# Patient Record
Sex: Male | Born: 1960 | Hispanic: No | Marital: Married | Smoking: Never smoker
Health system: Southern US, Community
[De-identification: ages and names within clinical notes are randomized; demographics above are authoritative.]

## PROBLEM LIST (undated history)

## (undated) ENCOUNTER — Ambulatory Visit (HOSPITAL_COMMUNITY): Admission: EM | Payer: Self-pay | Source: Home / Self Care

## (undated) DIAGNOSIS — E785 Hyperlipidemia, unspecified: Secondary | ICD-10-CM

## (undated) DIAGNOSIS — S61511A Laceration without foreign body of right wrist, initial encounter: Secondary | ICD-10-CM

## (undated) DIAGNOSIS — I10 Essential (primary) hypertension: Secondary | ICD-10-CM

## (undated) DIAGNOSIS — E119 Type 2 diabetes mellitus without complications: Secondary | ICD-10-CM

## (undated) HISTORY — DX: Type 2 diabetes mellitus without complications: E11.9

## (undated) HISTORY — PX: OTHER SURGICAL HISTORY: SHX169

## (undated) HISTORY — DX: Laceration without foreign body of right wrist, initial encounter: S61.511A

## (undated) HISTORY — DX: Hyperlipidemia, unspecified: E78.5

---

## 2008-11-05 DIAGNOSIS — E119 Type 2 diabetes mellitus without complications: Secondary | ICD-10-CM

## 2008-11-05 DIAGNOSIS — I10 Essential (primary) hypertension: Secondary | ICD-10-CM

## 2008-11-05 DIAGNOSIS — E785 Hyperlipidemia, unspecified: Secondary | ICD-10-CM

## 2008-11-05 HISTORY — DX: Essential (primary) hypertension: I10

## 2008-11-05 HISTORY — DX: Type 2 diabetes mellitus without complications: E11.9

## 2008-11-05 HISTORY — DX: Hyperlipidemia, unspecified: E78.5

## 2009-07-24 ENCOUNTER — Emergency Department (HOSPITAL_COMMUNITY): Admission: EM | Admit: 2009-07-24 | Discharge: 2009-07-24 | Payer: Self-pay | Admitting: Family Medicine

## 2009-09-07 ENCOUNTER — Emergency Department (HOSPITAL_COMMUNITY): Admission: EM | Admit: 2009-09-07 | Discharge: 2009-09-07 | Payer: Self-pay | Admitting: Emergency Medicine

## 2009-09-23 ENCOUNTER — Encounter: Admission: RE | Admit: 2009-09-23 | Discharge: 2009-09-23 | Payer: Self-pay | Admitting: Infectious Diseases

## 2009-10-03 ENCOUNTER — Emergency Department (HOSPITAL_COMMUNITY): Admission: EM | Admit: 2009-10-03 | Discharge: 2009-10-03 | Payer: Self-pay | Admitting: Family Medicine

## 2011-01-12 ENCOUNTER — Other Ambulatory Visit: Payer: Self-pay | Admitting: Infectious Diseases

## 2011-01-12 ENCOUNTER — Ambulatory Visit
Admission: RE | Admit: 2011-01-12 | Discharge: 2011-01-12 | Disposition: A | Discharge status: Home / Self Care | Payer: No Typology Code available for payment source | Source: Ambulatory Visit | Attending: Infectious Diseases | Admitting: Infectious Diseases

## 2011-01-12 DIAGNOSIS — R5383 Other fatigue: Secondary | ICD-10-CM

## 2011-01-12 DIAGNOSIS — R509 Fever, unspecified: Secondary | ICD-10-CM

## 2011-02-07 LAB — URINALYSIS, ROUTINE W REFLEX MICROSCOPIC
Bilirubin Urine: NEGATIVE
Hgb urine dipstick: NEGATIVE
Ketones, ur: NEGATIVE mg/dL
Nitrite: NEGATIVE
pH: 6.5 (ref 5.0–8.0)

## 2011-02-07 LAB — URINE CULTURE
Colony Count: NO GROWTH
Culture: NO GROWTH

## 2011-02-07 LAB — PREGNANCY, URINE: Preg Test, Ur: NEGATIVE

## 2011-06-19 ENCOUNTER — Inpatient Hospital Stay (INDEPENDENT_AMBULATORY_CARE_PROVIDER_SITE_OTHER)
Admission: RE | Admit: 2011-06-19 | Discharge: 2011-06-19 | Disposition: A | Discharge status: Home / Self Care | Payer: Self-pay | Source: Ambulatory Visit | Attending: Family Medicine | Admitting: Family Medicine

## 2011-06-19 DIAGNOSIS — I10 Essential (primary) hypertension: Secondary | ICD-10-CM

## 2015-08-15 ENCOUNTER — Encounter (HOSPITAL_COMMUNITY): Payer: Self-pay | Admitting: *Deleted

## 2015-08-15 ENCOUNTER — Emergency Department (INDEPENDENT_AMBULATORY_CARE_PROVIDER_SITE_OTHER)
Admission: EM | Admit: 2015-08-15 | Discharge: 2015-08-15 | Disposition: A | Discharge status: Home / Self Care | Payer: Self-pay | Source: Home / Self Care | Attending: Family Medicine | Admitting: Family Medicine

## 2015-08-15 DIAGNOSIS — I10 Essential (primary) hypertension: Secondary | ICD-10-CM

## 2015-08-15 HISTORY — DX: Essential (primary) hypertension: I10

## 2015-08-15 LAB — POCT I-STAT, CHEM 8
BUN: 14 mg/dL (ref 6–20)
CREATININE: 0.9 mg/dL (ref 0.61–1.24)
Calcium, Ion: 1.22 mmol/L (ref 1.12–1.23)
Chloride: 101 mmol/L (ref 101–111)
GLUCOSE: 128 mg/dL — AB (ref 65–99)
HCT: 44 % (ref 39.0–52.0)
HEMOGLOBIN: 15 g/dL (ref 13.0–17.0)
POTASSIUM: 3.8 mmol/L (ref 3.5–5.1)
Sodium: 140 mmol/L (ref 135–145)
TCO2: 26 mmol/L (ref 0–100)

## 2015-08-15 MED ORDER — LISINOPRIL 20 MG PO TABS: 20.0000 mg | ORAL_TABLET | Freq: Every day | ORAL | Status: DC

## 2015-08-15 NOTE — ED Provider Notes (Signed)
CSN: [id number]     Arrival date & time 08/15/15  1618 History   First MD Initiated Contact with Patient 08/15/15 1749     Chief Complaint  Patient presents with  . Medication Refill   (Consider location/radiation/quality/duration/timing/severity/associated sxs/prior Treatment) Patient is a 54 y.o. male presenting with hypertension. The history is provided by the patient.  Hypertension This is a chronic problem. The current episode started 2 days ago (out of med for 2 d.). The problem has not changed since onset.Pertinent negatives include no chest pain and no headaches.    Past Medical History  Diagnosis Date  . Hypertension    History reviewed. No pertinent past surgical history. History reviewed. No pertinent family history. Social History  Substance Use Topics  . Smoking status: Never Smoker   . Smokeless tobacco: None  . Alcohol Use: No    Review of Systems  Constitutional: Negative.   Respiratory: Negative.   Cardiovascular: Negative.  Negative for chest pain, palpitations and leg swelling.  Neurological: Negative for dizziness and headaches.  All other systems reviewed and are negative.   Allergies  Review of patient's allergies indicates no known allergies.  Home Medications   Prior to Admission medications   Medication Sig Start Date End Date Taking? Authorizing Provider  lisinopril (PRINIVIL,ZESTRIL) 20 MG tablet Take 1 tablet (20 mg total) by mouth daily. For blood pressure 08/15/15   Linna Hoff, MD   Meds Ordered and Administered this Visit  Medications - No data to display  BP 150/90 mmHg  Pulse 58  Temp(Src) 98.1 F (36.7 C) (Oral)  Resp 16  SpO2 99% No data found.   Physical Exam  Constitutional: He is oriented to person, place, and time. He appears well-developed and well-nourished.  Neck: Normal range of motion. Neck supple.  Cardiovascular: Normal rate, normal heart sounds and intact distal pulses.   Pulmonary/Chest: Effort normal and  breath sounds normal.  Abdominal: There is no tenderness.  Musculoskeletal: He exhibits no edema.  Lymphadenopathy:    He has no cervical adenopathy.  Neurological: He is alert and oriented to person, place, and time.  Skin: Skin is warm and dry.  Nursing note and vitals reviewed.   ED Course  Procedures (including critical care time)  Labs Review Labs Reviewed  POCT I-STAT, CHEM 8 - Abnormal; Notable for the following:    Glucose, Bld 128 (*)    All other components within normal limits    Imaging Review No results found.   Visual Acuity Review  Right Eye Distance:   Left Eye Distance:   Bilateral Distance:    Right Eye Near:   Left Eye Near:    Bilateral Near:         MDM   1. Essential hypertension    rx for lisinopril given.    Linna Hoff, MD 08/15/15 [phone]

## 2015-08-15 NOTE — ED Notes (Signed)
Pt  Is  Here  For  rx  Of  Lisinopril  20  Mg  Daily  He is  Out  Of meds  And  Could  Not  Get  An  appt  Until  Next  Month  He  States

## 2015-08-15 NOTE — Discharge Instructions (Signed)
See your doctor for further care °

## 2015-08-25 ENCOUNTER — Emergency Department (HOSPITAL_COMMUNITY): Payer: Worker's Compensation

## 2015-08-25 ENCOUNTER — Emergency Department (HOSPITAL_COMMUNITY)
Admission: EM | Admit: 2015-08-25 | Discharge: 2015-08-25 | Disposition: A | Discharge status: Home / Self Care | Payer: Worker's Compensation | Attending: Emergency Medicine | Admitting: Emergency Medicine

## 2015-08-25 ENCOUNTER — Encounter (HOSPITAL_COMMUNITY): Payer: Self-pay | Admitting: Neurology

## 2015-08-25 DIAGNOSIS — S51811A Laceration without foreign body of right forearm, initial encounter: Secondary | ICD-10-CM | POA: Insufficient documentation

## 2015-08-25 DIAGNOSIS — I10 Essential (primary) hypertension: Secondary | ICD-10-CM | POA: Diagnosis not present

## 2015-08-25 DIAGNOSIS — Z23 Encounter for immunization: Secondary | ICD-10-CM | POA: Insufficient documentation

## 2015-08-25 DIAGNOSIS — Z79899 Other long term (current) drug therapy: Secondary | ICD-10-CM | POA: Diagnosis not present

## 2015-08-25 DIAGNOSIS — Y9389 Activity, other specified: Secondary | ICD-10-CM | POA: Diagnosis not present

## 2015-08-25 DIAGNOSIS — Y998 Other external cause status: Secondary | ICD-10-CM | POA: Diagnosis not present

## 2015-08-25 DIAGNOSIS — W311XXA Contact with metalworking machines, initial encounter: Secondary | ICD-10-CM | POA: Insufficient documentation

## 2015-08-25 DIAGNOSIS — S61511A Laceration without foreign body of right wrist, initial encounter: Secondary | ICD-10-CM

## 2015-08-25 DIAGNOSIS — Y9289 Other specified places as the place of occurrence of the external cause: Secondary | ICD-10-CM | POA: Diagnosis not present

## 2015-08-25 HISTORY — DX: Laceration without foreign body of right wrist, initial encounter: S61.511A

## 2015-08-25 MED ORDER — LIDOCAINE-EPINEPHRINE (PF) 2 %-1:200000 IJ SOLN
10.0000 mL | Freq: Once | INTRAMUSCULAR | Status: AC
  Administered 2015-08-25: 10 mL via INTRADERMAL
  Filled 2015-08-25: qty 20

## 2015-08-25 MED ORDER — TETANUS-DIPHTH-ACELL PERTUSSIS 5-2.5-18.5 LF-MCG/0.5 IM SUSP
0.5000 mL | Freq: Once | INTRAMUSCULAR | Status: AC
  Administered 2015-08-25: 0.5 mL via INTRAMUSCULAR
  Filled 2015-08-25: qty 0.5

## 2015-08-25 MED ORDER — IBUPROFEN 600 MG PO TABS: 600.0000 mg | ORAL_TABLET | Freq: Four times a day (QID) | ORAL | Status: DC | PRN

## 2015-08-25 MED ORDER — BACITRACIN ZINC 500 UNIT/GM EX OINT: 1.0000 "application " | TOPICAL_OINTMENT | Freq: Two times a day (BID) | CUTANEOUS | Status: DC

## 2015-08-25 MED ORDER — TRAMADOL HCL 50 MG PO TABS: 50.0000 mg | ORAL_TABLET | Freq: Four times a day (QID) | ORAL | Status: DC | PRN

## 2015-08-25 NOTE — Discharge Instructions (Signed)
Ibuprofen for pain. Tramadol for severe pain. Bacitracin twice a day to the laceration. Keep splint on for support. Follow-up in 10 days for suture removal. Watch for any signs of infection and follow-up is appears to be infected. Return if any problems.   Laceration Care, Adult A laceration is a cut that goes through all of the layers of the skin and into the tissue that is right under the skin. Some lacerations heal on their own. Others need to be closed with stitches (sutures), staples, skin adhesive strips, or skin glue. Proper laceration care minimizes the risk of infection and helps the laceration to heal better. HOW TO CARE FOR YOUR LACERATION If sutures or staples were used:  Keep the wound clean and dry.  If you were given a bandage (dressing), you should change it at least one time per day or as told by your health care provider. You should also change it if it becomes wet or dirty.  Keep the wound completely dry for the first 24 hours or as told by your health care provider. After that time, you may shower or bathe. However, make sure that the wound is not soaked in water until after the sutures or staples have been removed.  Clean the wound one time each day or as told by your health care provider:  Wash the wound with soap and water.  Rinse the wound with water to remove all soap.  Pat the wound dry with a clean towel. Do not rub the wound.  After cleaning the wound, apply a thin layer of antibiotic ointmentas told by your health care provider. This will help to prevent infection and keep the dressing from sticking to the wound.  Have the sutures or staples removed as told by your health care provider. If skin adhesive strips were used:  Keep the wound clean and dry.  If you were given a bandage (dressing), you should change it at least one time per day or as told by your health care provider. You should also change it if it becomes dirty or wet.  Do not get the skin  adhesive strips wet. You may shower or bathe, but be careful to keep the wound dry.  If the wound gets wet, pat it dry with a clean towel. Do not rub the wound.  Skin adhesive strips fall off on their own. You may trim the strips as the wound heals. Do not remove skin adhesive strips that are still stuck to the wound. They will fall off in time. If skin glue was used:  Try to keep the wound dry, but you may briefly wet it in the shower or bath. Do not soak the wound in water, such as by swimming.  After you have showered or bathed, gently pat the wound dry with a clean towel. Do not rub the wound.  Do not do any activities that will make you sweat heavily until the skin glue has fallen off on its own.  Do not apply liquid, cream, or ointment medicine to the wound while the skin glue is in place. Using those may loosen the film before the wound has healed.  If you were given a bandage (dressing), you should change it at least one time per day or as told by your health care provider. You should also change it if it becomes dirty or wet.  If a dressing is placed over the wound, be careful not to apply tape directly over the skin glue. Doing  that may cause the glue to be pulled off before the wound has healed.  Do not pick at the glue. The skin glue usually remains in place for 5-10 days, then it falls off of the skin. General Instructions  Take over-the-counter and prescription medicines only as told by your health care provider.  If you were prescribed an antibiotic medicine or ointment, take or apply it as told by your doctor. Do not stop using it even if your condition improves.  To help prevent scarring, make sure to cover your wound with sunscreen whenever you are outside after stitches are removed, after adhesive strips are removed, or when glue remains in place and the wound is healed. Make sure to wear a sunscreen of at least 30 SPF.  Do not scratch or pick at the wound.  Keep all  follow-up visits as told by your health care provider. This is important.  Check your wound every day for signs of infection. Watch for:  Redness, swelling, or pain.  Fluid, blood, or pus.  Raise (elevate) the injured area above the level of your heart while you are sitting or lying down, if possible. SEEK MEDICAL CARE IF:  You received a tetanus shot and you have swelling, severe pain, redness, or bleeding at the injection site.  You have a fever.  A wound that was closed breaks open.  You notice a bad smell coming from your wound or your dressing.  You notice something coming out of the wound, such as wood or glass.  Your pain is not controlled with medicine.  You have increased redness, swelling, or pain at the site of your wound.  You have fluid, blood, or pus coming from your wound.  You notice a change in the color of your skin near your wound.  You need to change the dressing frequently due to fluid, blood, or pus draining from the wound.  You develop a new rash.  You develop numbness around the wound. SEEK IMMEDIATE MEDICAL CARE IF:  You develop severe swelling around the wound.  Your pain suddenly increases and is severe.  You develop painful lumps near the wound or on skin that is anywhere on your body.  You have a red streak going away from your wound.  The wound is on your hand or foot and you cannot properly move a finger or toe.  The wound is on your hand or foot and you notice that your fingers or toes look pale or bluish.   This information is not intended to replace advice given to you by your health care provider. Make sure you discuss any questions you have with your health care provider.   Document Released: 10/22/2005 Document Revised: 03/08/2015 Document Reviewed: 10/18/2014 Elsevier Interactive Patient Education [employer].

## 2015-08-25 NOTE — ED Notes (Signed)
Pt stable, ambulatory, states understanding of discharge instructions 

## 2015-08-25 NOTE — ED Notes (Signed)
Pt was working today and went to get a part out of a machine and the machine was still on. Has 2 inch laceration to right medial lower forearm. Is "C" shaped, bleeding controlled.

## 2015-08-25 NOTE — ED Provider Notes (Signed)
CSN: [id number]     Arrival date & time 08/25/15  1510 History   First MD Initiated Contact with Patient 08/25/15 1537     Chief Complaint  Patient presents with  . Extremity Laceration     (Consider location/radiation/quality/duration/timing/severity/associated sxs/prior Treatment) HPI Craig Miller is a 54 y.o. male history of hypertension, presents to emergency department with laceration to the right forearm. Patient states he was at work, working with sharp machinery, when he states machine slipped and cut him on the dorsal right wrist. He reports bleeding which is now controlled with pressure. He states he is having pain with range of motion of the wrist joint. He denies any numbness or tingling distal to the injury. He is unsure of his tetanus shot. No other treatments provided.  Past Medical History  Diagnosis Date  . Hypertension    History reviewed. No pertinent past surgical history. No family history on file. Social History  Substance Use Topics  . Smoking status: Never Smoker   . Smokeless tobacco: None  . Alcohol Use: No    Review of Systems  Constitutional: Negative for fever and chills.  Respiratory: Negative for cough, chest tightness and shortness of breath.   Cardiovascular: Negative for chest pain, palpitations and leg swelling.  Musculoskeletal: Positive for arthralgias. Negative for myalgias, neck pain and neck stiffness.  Skin: Positive for wound. Negative for rash.  Allergic/Immunologic: Negative for immunocompromised state.  Neurological: Negative for dizziness.      Allergies  Review of patient's allergies indicates no known allergies.  Home Medications   Prior to Admission medications   Medication Sig Start Date End Date Taking? Authorizing Provider  lisinopril (PRINIVIL,ZESTRIL) 20 MG tablet Take 20 mg by mouth every evening.   Yes Historical Provider, MD   BP 190/92 mmHg  Pulse 52  Temp(Src) 98.2 F (36.8 C) (Oral)  Resp 14  SpO2  98% Physical Exam  Constitutional: He appears well-developed and well-nourished. No distress.  HENT:  Head: Normocephalic and atraumatic.  Eyes: Conjunctivae are normal.  Neck: Neck supple.  Cardiovascular: Normal rate, regular rhythm and normal heart sounds.   Pulmonary/Chest: Effort normal. No respiratory distress. He has no wheezes. He has no rales.  Musculoskeletal: He exhibits no edema.  Full range of motion of the wrist and all fingers. 5 out of 5 strength with wrist flexion and extension against resistance. 5 out of 5 strength with all finger flexion and extension against resistance at all joints. Sensation is intact distally in all fingers dorsally and ventrally. Distal radial pulse intact. Cap refill less than 2 seconds distally.  Neurological: He is alert.  Skin: Skin is warm and dry.  7cm deep flap laceration to the dorsal left wrist. Hemostatic.   Nursing note and vitals reviewed.   ED Course  Procedures (including critical care time) Labs Review Labs Reviewed - No data to display  Imaging Review No results found. I have personally reviewed and evaluated these images and lab results as part of my medical decision-making.   EKG Interpretation None      LACERATION REPAIR Performed by: Lottie Mussel Authorized by: Jaynie Crumble A Consent: Verbal consent obtained. Risks and benefits: risks, benefits and alternatives were discussed Consent given by: patient Patient identity confirmed: provided demographic data Prepped and Draped in normal sterile fashion Wound explored  Laceration Location: right wrist  Laceration Length: 7cm  No Foreign Bodies seen or palpated  Anesthesia: local infiltration  Local anesthetic: lidocaine 2% w epinephrine  Anesthetic  total: 4 ml  Irrigation method: syringe Amount of cleaning: standard  Skin closure: deep vicril rapid 4.0, superficial prolene 4.0  Number of sutures: 2 deep through fascia, 7  superficial  Technique: simple interrupted  Patient tolerance: Patient tolerated the procedure well with no immediate complications.  MDM   Final diagnoses:  Laceration of forearm, right, initial encounter     patient with deep flap laceration to the dorsal right wrist. Repaired with sutures, requiring some deep tissue sutures to pull fascia together. There is no evidence of nerve or tendon injury. He has full strength and sensation to the hand. Will place in the Velcro splint for support. Home with tramadol, naproxen, topical antibiotic, follow up as needed in for suture removal. Tetanus updated. Patient was found to have elevated blood pressure, instructed to follow-up with primary care doctor.  Filed Vitals:   08/25/15 1527 08/25/15 1749  BP: 190/92 166/85  Pulse: 52 58  Temp: 98.2 F (36.8 C)   TempSrc: Oral   Resp: 14 16  SpO2: 98% 99%      Jaynie Crumbleatyana Lamyia Cdebaca, PA-C 08/25/15 2021  Doug SouSam Jacubowitz, MD 08/26/15 0045

## 2015-08-26 ENCOUNTER — Encounter (HOSPITAL_COMMUNITY): Payer: Self-pay | Admitting: *Deleted

## 2015-09-05 ENCOUNTER — Encounter (HOSPITAL_COMMUNITY): Payer: Self-pay | Admitting: Emergency Medicine

## 2015-09-05 ENCOUNTER — Emergency Department (HOSPITAL_COMMUNITY)
Admission: EM | Admit: 2015-09-05 | Discharge: 2015-09-05 | Disposition: A | Discharge status: Home / Self Care | Payer: Self-pay | Attending: Emergency Medicine | Admitting: Emergency Medicine

## 2015-09-05 DIAGNOSIS — I10 Essential (primary) hypertension: Secondary | ICD-10-CM | POA: Insufficient documentation

## 2015-09-05 DIAGNOSIS — X58XXXD Exposure to other specified factors, subsequent encounter: Secondary | ICD-10-CM | POA: Insufficient documentation

## 2015-09-05 DIAGNOSIS — Z792 Long term (current) use of antibiotics: Secondary | ICD-10-CM | POA: Insufficient documentation

## 2015-09-05 DIAGNOSIS — Z5189 Encounter for other specified aftercare: Secondary | ICD-10-CM

## 2015-09-05 DIAGNOSIS — S6991XD Unspecified injury of right wrist, hand and finger(s), subsequent encounter: Secondary | ICD-10-CM

## 2015-09-05 DIAGNOSIS — Z4801 Encounter for change or removal of surgical wound dressing: Secondary | ICD-10-CM | POA: Insufficient documentation

## 2015-09-05 DIAGNOSIS — Z79899 Other long term (current) drug therapy: Secondary | ICD-10-CM | POA: Insufficient documentation

## 2015-09-05 NOTE — ED Provider Notes (Signed)
Laceration 10 days ago during crush injury at work, right dorsal wrist with laceration that was repaired, at the time no neurovascular compromise, no tendon compromise, states that over the last week he has developed numbness and weakness in the third and fourth digits of the right hand. On exam the laceration appears to be healing well, no redness drainage or tenderness, he has decreased sensation to the third and fourth digit, normal extension at the wrist, normal flexion at the wrist, normal flexion of the fingers, increased strength with extension of the third and fourth digits of the right hand.  Discussed with nurse at hand surgical offices, they have discussed this with the physician assistant, the patient will be seen tomorrow by Dr. Mina MarbleWeingold, the patient is to call and make an appointment according to the nurse at the office. Patient has been given this information  Medical screening examination/treatment/procedure(s) were conducted as a shared visit with non-physician practitioner(s) and myself.  I personally evaluated the patient during the encounter.  Clinical Impression:   Final diagnoses:  Hand injury, right, subsequent encounter  Visit for wound check         Eber HongBrian Coden Franchi, MD 09/07/15 0710

## 2015-09-05 NOTE — ED Provider Notes (Signed)
CSN: [id number]     Arrival date & time 09/05/15  0919 History  By signing my name below, I, Craig Miller, attest that this documentation has been prepared under the direction and in the presence of non-physician practitioner, Dierdre ForthHannah Naw Lasala, PA-C. Electronically Signed: Freida Busmaniana Miller, Scribe. 09/05/2015. 11:06 AM.  Chief Complaint  Patient presents with  . Hand Pain   The history is provided by the patient and medical records. No language interpreter was used.    HPI Comments:  Craig Miller is a 54 y.o. male who presents to the Emergency Department complaining of right hand pain. Pt reports pain with movement of the hand. He also notes difficulty moving his right middle and ring fingers for 9 days.  Pt originallly injured the right forearm on 08/25/15 with a machine; he was evaluated in the ED after injury. He had the laceration to his forearm repaired, was placed in a splint, had tetanus updated and was discharged home with naproxen, and topical antibiotic. Pt has cleaned the wound twice since discharge. No alleviating factors noted.  Patient also reports associated decreased sensation in the right middle, ring and little fingers.  Past Medical History  Diagnosis Date  . Hypertension    History reviewed. No pertinent past surgical history. History reviewed. No pertinent family history. Social History  Substance Use Topics  . Smoking status: Never Smoker   . Smokeless tobacco: None  . Alcohol Use: No    Review of Systems  Constitutional: Negative for fever and chills.  Gastrointestinal: Negative for nausea and vomiting.  Musculoskeletal: Positive for joint swelling and arthralgias. Negative for back pain, neck pain and neck stiffness.       Decreased range of motion  Skin: Positive for wound.  Neurological: Positive for numbness (decreased sensation).  Hematological: Does not bruise/bleed easily.  Psychiatric/Behavioral: The patient is not nervous/anxious.   All  other systems reviewed and are negative.     Allergies  Review of patient's allergies indicates no known allergies.  Home Medications   Prior to Admission medications   Medication Sig Start Date End Date Taking? Authorizing Provider  bacitracin ointment Apply 1 application topically 2 (two) times daily. 08/25/15   Tatyana Kirichenko, PA-C  ibuprofen (ADVIL,MOTRIN) 600 MG tablet Take 1 tablet (600 mg total) by mouth every 6 (six) hours as needed. 08/25/15   Tatyana Kirichenko, PA-C  lisinopril (PRINIVIL,ZESTRIL) 20 MG tablet Take 1 tablet (20 mg total) by mouth daily. For blood pressure 08/15/15   Linna HoffJames D Kindl, MD  lisinopril (PRINIVIL,ZESTRIL) 20 MG tablet Take 20 mg by mouth every evening.    Historical Provider, MD  traMADol (ULTRAM) 50 MG tablet Take 1 tablet (50 mg total) by mouth every 6 (six) hours as needed. 08/25/15   Tatyana Kirichenko, PA-C   BP 174/91 mmHg  Pulse 69  Temp(Src) 98.6 F (37 C) (Oral)  Resp 16  Ht 5' 2.99" (1.6 m)  Wt 148 lb (67.132 kg)  BMI 26.22 kg/m2  SpO2 99% Physical Exam  Constitutional: He appears well-developed and well-nourished. No distress.  HENT:  Head: Normocephalic and atraumatic.  Eyes: Conjunctivae are normal.  Neck: Normal range of motion.  Cardiovascular: Normal rate, regular rhythm, normal heart sounds and intact distal pulses.   No murmur heard. Capillary refill < 3 sec  Pulmonary/Chest: Effort normal and breath sounds normal.  Musculoskeletal: He exhibits tenderness. He exhibits no edema.  ROM: significantly limited ROM of the right wrist with pain  Mild persistent flexion of the right  middle, ring and little fingers at rest  Decreased flexion of all fingers Full active extension of right thumb and pointer fingers with decreased active extension of the middle, ring, and little finger; full passive flexion and extension of all fingers No tenderness to palpation of any of the tendons   Neurological: He is alert. Coordination  normal.  Sensation intact though subjective decreased sensation in right middle, ring, and little fingers  Strength: 4/5 grip strength and finger flexion with right hand and 2/5 strength with extension extension of the right middle, ring, and little fingers  4/5 strength with extension of the right thumb and pointer finger  Skin: Skin is warm and dry. He is not diaphoretic.  No tenting of the skin 7 cm lac to the dorsum of the right wrsit with 7 suture sin tact no prulent drainge ery or increased warmth   Psychiatric: He has a normal mood and affect.  Nursing note and vitals reviewed.   ED Course  Procedures   DIAGNOSTIC STUDIES:  Oxygen Saturation is 100% on RA, normal by my interpretation.    COORDINATION OF CARE:  9:31 AM Discussed treatment plan with pt at bedside and pt agreed to plan.  Labs Review Labs Reviewed - No data to display  Imaging Review No results found.   EKG Interpretation None      MDM   Final diagnoses:  Hand injury, right, subsequent encounter  Visit for wound check    [name] presents for suture removal and decreased extension of the right middle, ring and little fingers.  Record review shows pt had full ROM of right wrist and fingers with 5/5 strength at time of injury and laceration repair.  Laceration appears clean without erythema, induration or purulent drainage. It is healing well.  Patient's exam concerning for possible tendon or nerve damage.   Patient was discussed with and evaluated by Dr. Eber Hong who consulted with hand surgery. Dr. Mina Marble will see the patient in his office tomorrow for further evaluation. Sutures left in place for evaluation at hand surgery office.  Wound was re-bandaged and and patient's cock up wrist splint was placed back on his arm.    Pt reports understanding of his follow-up instructions.  ,I personally performed the services described in this documentation, which was scribed in my presence.  The recorded information has been reviewed and is accurate.    Dierdre Forth, PA-C 09/05/15 1106  Eber Hong, MD 09/07/15 [phone]

## 2015-09-05 NOTE — ED Notes (Signed)
Pt reports he injured his R hand/wrist and has pain with moving; states he cant straighten middle and ring finger

## 2015-09-05 NOTE — Discharge Instructions (Signed)
1. Medications: Tylenol or ibuprofen for pain, usual home medications 2. Treatment: ice for swelling, keep wound clean with warm soap and water and keep bandage dry,  3. Follow Up: Please follow-up with Dr. Mina MarbleWeingold tomorrow for further evaluation    Emergency Department Resource Guide 1) Find a Doctor and Pay Out of Pocket Although you won't have to find out who is covered by your insurance plan, it is a good idea to ask around and get recommendations. You will then need to call the office and see if the doctor you have chosen will accept you as a new patient and what types of options they offer for patients who are self-pay. Some doctors offer discounts or will set up payment plans for their patients who do not have insurance, but you will need to ask so you aren't surprised when you get to your appointment.  2) Contact Your Local Health Department Not all health departments have doctors that can see patients for sick visits, but many do, so it is worth a call to see if yours does. If you don't know where your local health department is, you can check in your phone book. The CDC also has a tool to help you locate your state's health department, and many state websites also have listings of all of their local health departments.  3) Find a Walk-in Clinic If your illness is not likely to be very severe or complicated, you may want to try a walk in clinic. These are popping up all over the country in pharmacies, drugstores, and shopping centers. They're usually staffed by nurse practitioners or physician assistants that have been trained to treat common illnesses and complaints. They're usually fairly quick and inexpensive. However, if you have serious medical issues or chronic medical problems, these are probably not your best option.  No Primary Care Doctor: - Call Health Connect at  [phone] - they can help you locate a primary care doctor that  accepts your insurance, provides certain services,  etc. - Physician Referral Service- [phone]  Chronic Pain Problems: Organization         Address  Phone   Notes  [facility]  [phone]) [phone] Patients need to be referred by their primary care doctor.   Medication Assistance: Organization         Address  Phone   Notes  [facility] [address] [place], [place] [address] [phone]) [phone] --Must be a resident of [facility] -- Must have NO insurance coverage whatsoever (no Medicaid/ Medicare, etc.) -- The pt. MUST have a primary care doctor that directs their care regularly and follows them in the community   MedAssist  [phone]   [employer]  [phone]    Agencies that provide inexpensive medical care: Organization         Address  Phone   Notes  [facility]  [phone]   [facility]    [phone]) [phone]   [facility] [address] [place], [place] [address] [phone]) [phone]   [facility] [address], [place] [phone]) [phone]   [facility]anned Parenthood    [phone]   [facility]    [phone]   [facility]  [address], [place] Phone:  [phone](336) [phone], Fax:  [phone] Hours of Operation:  9 am - 6 pm, M-F.  Also accepts Medicaid/Medicare  and self-pay.  [facility] for [place], [address], [place] Phone: [phone], Fax: [phone]. Hours of Operation:  8:30 am - 5:30 pm, M-F.  Also accepts Medicaid and self-pay.  [facility] [place] [address], [place] Phone: [phone]   [place], [place], [place] [phone], Ext. [phone] Mondays & Thursdays: 7-9 AM.  First 15 patients are seen on a first come, first serve basis.    [place] Providers:  Organization         Address  Phone   Notes  [facility] [address], [place] [phone] Also accepts self-pay patients.  [facility] [address], [place]  [phone]   [place], [address], [place] [phone]   [facility] [address] [place] [phone]   Lucianne Lei [address], [place]   [phone] Only accepts [place] Access [place] patients after they have their name applied to their card.   Self-Pay (no insurance) in [facility]:  Organization         Address  Phone   Notes  Sickle Cell Patients, [facility] [place] [phone]   [facility] [place] [phone]   [facility] [place]  [place], [place], [place] [phone]   [facility]/Dr. Osei-Bonsu  [address] [place] or [address], [place] [phone] Phone number for both [place] and [place] locations is the same.  [facility]gent [facility] [address] [place] [phone]   [facility] [address] [place] or [address] [phone]   [facility] [address] [place] [phone], phone; [phone], fax Sees patients 1st and 3rd Saturday of every month.  Must not qualify for public or private insurance (i.e. Medicaid, Medicare, [place] Health Choice, Veterans' Benefits)  Household income should be no more than 200% of the poverty level The clinic cannot treat you if you are pregnant or think you are pregnant  Sexually transmitted diseases are not treated at the clinic.    Dental Care: Organization         Address  Phone  Notes  [facility] Department of [facility] [phone] Accepts children up to  age 26 who are enrolled in [place] or [place]; pregnant women with a Medicaid card; and children who have applied for Medicaid or [place] Health Choice, but were declined, whose parents can pay a reduced fee at time of service.  [facility] Department of [facility]  [address], [place] [phone] Accepts children up to age 43 who are enrolled in [place] or [place]; pregnant women with a Medicaid card; and children who have applied for Medicaid or [place] Health Choice, but were declined, whose parents can pay a reduced fee at time of service.  [facility]  [place] [phone] Patients are seen by appointment only. Walk-ins are not accepted. [place] will see patients 55 years of age and older. Monday - Tuesday (8am-5pm) Most Wednesdays (  8:30-5pm) $30 per visit, cash only  [facility] Adult [facility] Access PROGRAM  [address], [facility] [phone] Patients are seen by appointment only. Walk-ins are not accepted. [place] will see patients 72 years of age and older. One Wednesday Evening (Monthly: Volunteer Based).  $30 per visit, cash only  [place]  [phone] for adults; Children under age 45, call Graduate Pediatric Dentistry at [phone]. Children aged 7-14, please call [phone] to request a pediatric application.  Dental services are provided in all areas of dental care including fillings, crowns and bridges, complete and partial dentures, implants, gum treatment, root canals, and extractions. Preventive care is also provided. Treatment is provided to both adults and children. Patients are selected via a lottery and there is often a waiting list.   [facility] [address], [place]  [phone] [email or web address]   [facility] [address] [place], [place] [phone], Ext. [phone] Second and Fourth Thursday of  each month, opens at 6:30 AM; Clinic ends at 9 AM.  Patients are seen on a first-come first-served basis, and a limited number are seen during each clinic.   [facility]  [address] Hillard Danker [place], [place] [phone]   Eligibility Requirements You must have lived in [place], [place], or [place] counties for at least the last three months.   You cannot be eligible for state or federal sponsored [employer], including [employer], [place], or [employer].   You generally cannot be eligible for healthcare insurance through your employer.    How to apply: Eligibility screenings are held every Tuesday and Wednesday afternoon from 1:00 pm until 4:00 pm. You do not need an appointment for the interview!  [facility] [address], [place], [place]   [place]  [place] Department  [place]  [phone]    Behavioral Health Resources in the Community: Intensive Outpatient Programs Organization         Address  Phone  Notes  [place] [address], [place], [place] [phone]   [facility] Outpatient [address] [place], [place]   ADS: Alcohol & Drug Svcs [address] [place], [place]   [place] [address],  [place], [place] or [phone]   Substance Abuse Resources Organization         Address  Phone  Notes  Alcohol and Drug Services  [phone]   [place]  [phone]   [facility] [place]   Chinita Pester  [phone]   Residential & Outpatient Substance Abuse Program  [phone]   Psychological Services Organization         Address  Phone  Notes  [facility] [place]  [place]  [phone]   [place] [address],  [place] or [phone]    Mobile Crisis Teams Organization         Address  Phone  Notes  [facility], Mobile Crisis Care Unit  [phone]   [facility]ive Psychotherapeutic Services  [address] [place], [place]   [place] Levels [address] [place] [phone]    Self-Help/Support Groups Organization         Address  Phone  Notes  [facility] Health Assoc[facility] of [place] - variety of support groups  [place] Call for more information  Narcotics Anonymous (NA), [facility] [address], [employer] [place]  2 meetings at this location   Special educational needs teacher         Address  Phone  Notes  [facility]idential Treatment [place],    [place]  [phone]   [facility]  [address], [place] [phone], [place], [place]   [place] [address] [place], [place] [phone] Admissions: 8am-3pm M-F  Incentives Substance [place] [address],    [place], [place] [phone]   [facility] [address] [place], [place], [place]   [facility] [address],  [place], [place]   [facility] Programs - Intensive Outpatient [address] Krist[address], [place], [place]   [facility] ([place].,  [place], [place] [phone] or [phone]   [facility] Services (RTS) [address], [place], [place] Accepts Medicaid  [facility] [place] [address],  [place] [phone] Substance Abuse/Addiction Treatment   [facility] Organization         Address  Phone  Notes  [facility]  [phone]   Domenic Schwab, PhD [address] Arlis Porta [place], [place]   [phone] or [phone]   [place], [place] [phone]     [facility] [address] [place], [place] [phone] Insurance/Medicaid/sponsorship through [facility] and Families [address], [address]                                    [place], [place] [phone] [place] [address], [place] [phone]    Dr. Adele Schilder  [phone]   [facility] Dept. 1) [address], [place] 2) [place] 3)  [place] [address], [place] [phone]  [phone]   [place] [phone] or [phone] (After Hours)

## 2015-11-28 ENCOUNTER — Ambulatory Visit (INDEPENDENT_AMBULATORY_CARE_PROVIDER_SITE_OTHER): Payer: Self-pay | Admitting: Internal Medicine

## 2015-11-28 ENCOUNTER — Encounter: Payer: Self-pay | Admitting: Internal Medicine

## 2015-11-28 VITALS — BP 194/100 | HR 60 | Resp 16 | Ht 64.0 in | Wt 139.0 lb

## 2015-11-28 DIAGNOSIS — E785 Hyperlipidemia, unspecified: Secondary | ICD-10-CM

## 2015-11-28 DIAGNOSIS — E119 Type 2 diabetes mellitus without complications: Secondary | ICD-10-CM

## 2015-11-28 DIAGNOSIS — I10 Essential (primary) hypertension: Secondary | ICD-10-CM

## 2015-11-28 DIAGNOSIS — Z79899 Other long term (current) drug therapy: Secondary | ICD-10-CM

## 2015-11-28 MED ORDER — LISINOPRIL 20 MG PO TABS: 20.0000 mg | ORAL_TABLET | Freq: Every day | ORAL | Status: DC

## 2015-11-28 NOTE — Progress Notes (Signed)
   Subjective:    Patient ID: Craig Miller, male    DOB: [birth year], 55 y.o.   MRN: [id number]  HPI  1.  Hypertension:  Diagn[place] 2010 when [place]  Originally from [place].  Amharic speaking, but speaks [place] well.    2.  DM:  Also diagnosed in 2010.  Changed diet and controls with diet.  Cannot recall previous A1C level.  No history of eye check in last several years. No history of renal disease. No history of peripheral neuropathy or numbness, tingling, burning of feet or hands. Did not get flu vaccine this year. No history of pneumovax    Review of Systems     Objective:   Physical Exam HEENT:  PERRL, EOMI, discs sharp, mild AV nicking, throat without injection Neck:  Supple, no adenopathy, no thyromegaly Chest:  CTA CV:  RRR with normal S1, S2 sounds almost like an echo, but no murmur appreciated.  No rub.  No carotid bruits, Carotid, Radial and DP pulses normal and equal. Abd:  S, NT, +BS. Extrems:  No edema.       Assessment & Plan:  1.  Essential Hypertension:  Not clear how long he has been off Lisinopril, perhaps a month.  States BP was in normal range when taking.   Restart Lisinopril. BP check with nurse in 2 weeks. F/U with me in 1 month Fasting today:  CMP  2.  DM:  Diet controlled.  Not clear how well controlled in recent year.  Obtained care at Urgent care and no A1C in Epic.   Check A1C, FLP, Urine microalbumin Recommended obtaining [facility] at [facility] as we are out. Pneumovax as well. Tdap up to date.  3.  Hyperlipidemia:  FLP

## 2015-11-28 NOTE — Patient Instructions (Signed)
Can google "advance directives, [place]"  And bring up form from Secretary of State. Print and fill out Or can go to "5 wishes"  Which is also in Spanish and fill out--this costs $5--perhaps easier to use. Designate a Medical Power of Attorney to speak for you if you are unable to speak for yourself when ill or injured  

## 2015-11-29 LAB — COMPREHENSIVE METABOLIC PANEL
ALT: 22 IU/L (ref 0–44)
AST: 15 IU/L (ref 0–40)
Albumin/Globulin Ratio: 1.5 (ref 1.1–2.5)
Albumin: 4.5 g/dL (ref 3.5–5.5)
Alkaline Phosphatase: 110 IU/L (ref 39–117)
BILIRUBIN TOTAL: 0.5 mg/dL (ref 0.0–1.2)
BUN/Creatinine Ratio: 19 (ref 9–20)
BUN: 16 mg/dL (ref 6–24)
CO2: 27 mmol/L (ref 18–29)
CREATININE: 0.86 mg/dL (ref 0.76–1.27)
Calcium: 9.6 mg/dL (ref 8.7–10.2)
Chloride: 99 mmol/L (ref 96–106)
GFR calc non Af Amer: 100 mL/min/{1.73_m2} (ref >59)
GFR, EST AFRICAN AMERICAN: 115 mL/min/{1.73_m2} (ref >59)
GLUCOSE: 122 mg/dL — AB (ref 65–99)
Globulin, Total: 3.1 g/dL (ref 1.5–4.5)
POTASSIUM: 5 mmol/L (ref 3.5–5.2)
Sodium: 139 mmol/L (ref 134–144)
TOTAL PROTEIN: 7.6 g/dL (ref 6.0–8.5)

## 2015-11-29 LAB — LIPID PANEL W/O CHOL/HDL RATIO
CHOLESTEROL TOTAL: 231 mg/dL — AB (ref 100–199)
HDL: 34 mg/dL — AB (ref >39)
LDL CALC: 161 mg/dL — AB (ref 0–99)
TRIGLYCERIDES: 179 mg/dL — AB (ref 0–149)
VLDL CHOLESTEROL CAL: 36 mg/dL (ref 5–40)

## 2015-11-29 LAB — MICROALBUMIN, URINE: MICROALBUM., U, RANDOM: 26.7 ug/mL

## 2015-11-29 LAB — HGB A1C W/O EAG: Hgb A1c MFr Bld: 7.4 % — ABNORMAL HIGH (ref 4.8–5.6)

## 2015-12-27 ENCOUNTER — Ambulatory Visit: Payer: Self-pay

## 2015-12-27 VITALS — BP 160/100 | HR 62

## 2015-12-27 DIAGNOSIS — I10 Essential (primary) hypertension: Secondary | ICD-10-CM

## 2016-01-23 ENCOUNTER — Ambulatory Visit: Payer: Self-pay | Admitting: Internal Medicine

## 2016-03-27 ENCOUNTER — Ambulatory Visit: Payer: Self-pay | Admitting: Internal Medicine

## 2016-03-27 ENCOUNTER — Encounter (HOSPITAL_COMMUNITY): Payer: Self-pay | Admitting: Emergency Medicine

## 2016-03-27 ENCOUNTER — Ambulatory Visit (HOSPITAL_COMMUNITY)
Admission: EM | Admit: 2016-03-27 | Discharge: 2016-03-27 | Disposition: A | Discharge status: Nursing Home (Includes ICF) | Payer: Self-pay | Attending: Family Medicine | Admitting: Family Medicine

## 2016-03-27 ENCOUNTER — Emergency Department (HOSPITAL_COMMUNITY)
Admission: EM | Admit: 2016-03-27 | Discharge: 2016-03-27 | Disposition: A | Discharge status: Home / Self Care | Payer: Self-pay | Attending: Emergency Medicine | Admitting: Emergency Medicine

## 2016-03-27 DIAGNOSIS — Z79899 Other long term (current) drug therapy: Secondary | ICD-10-CM | POA: Insufficient documentation

## 2016-03-27 DIAGNOSIS — E1165 Type 2 diabetes mellitus with hyperglycemia: Secondary | ICD-10-CM

## 2016-03-27 DIAGNOSIS — IMO0001 Reserved for inherently not codable concepts without codable children: Secondary | ICD-10-CM

## 2016-03-27 DIAGNOSIS — R739 Hyperglycemia, unspecified: Secondary | ICD-10-CM

## 2016-03-27 DIAGNOSIS — R001 Bradycardia, unspecified: Secondary | ICD-10-CM | POA: Insufficient documentation

## 2016-03-27 DIAGNOSIS — I10 Essential (primary) hypertension: Secondary | ICD-10-CM | POA: Insufficient documentation

## 2016-03-27 LAB — BASIC METABOLIC PANEL
Anion gap: 8 (ref 5–15)
BUN: 16 mg/dL (ref 6–20)
CALCIUM: 9.3 mg/dL (ref 8.9–10.3)
CO2: 27 mmol/L (ref 22–32)
Chloride: 98 mmol/L — ABNORMAL LOW (ref 101–111)
Creatinine, Ser: 1.02 mg/dL (ref 0.61–1.24)
GFR calc Af Amer: >60 mL/min (ref >60)
GLUCOSE: 507 mg/dL — AB (ref 65–99)
Potassium: 4.4 mmol/L (ref 3.5–5.1)
Sodium: 133 mmol/L — ABNORMAL LOW (ref 135–145)

## 2016-03-27 LAB — POCT I-STAT, CHEM 8
BUN: 20 mg/dL (ref 6–20)
CREATININE: 0.9 mg/dL (ref 0.61–1.24)
Calcium, Ion: 1.18 mmol/L (ref 1.12–1.23)
Chloride: 96 mmol/L — ABNORMAL LOW (ref 101–111)
GLUCOSE: 440 mg/dL — AB (ref 65–99)
HEMATOCRIT: 47 % (ref 39.0–52.0)
HEMOGLOBIN: 16 g/dL (ref 13.0–17.0)
Potassium: 4.3 mmol/L (ref 3.5–5.1)
Sodium: 136 mmol/L (ref 135–145)
TCO2: 29 mmol/L (ref 0–100)

## 2016-03-27 LAB — POCT URINALYSIS DIP (DEVICE)
Bilirubin Urine: NEGATIVE
GLUCOSE, UA: 500 mg/dL — AB
HGB URINE DIPSTICK: NEGATIVE
Ketones, ur: NEGATIVE mg/dL
Leukocytes, UA: NEGATIVE
Nitrite: NEGATIVE
PROTEIN: NEGATIVE mg/dL
SPECIFIC GRAVITY, URINE: 1.015 (ref 1.005–1.030)
UROBILINOGEN UA: 0.2 mg/dL (ref 0.0–1.0)
pH: 5.5 (ref 5.0–8.0)

## 2016-03-27 LAB — CBG MONITORING, ED
GLUCOSE-CAPILLARY: 381 mg/dL — AB (ref 65–99)
GLUCOSE-CAPILLARY: 270 mg/dL — AB (ref 65–99)

## 2016-03-27 LAB — CBC
HCT: 41 % (ref 39.0–52.0)
Hemoglobin: 13.7 g/dL (ref 13.0–17.0)
MCH: 27.1 pg (ref 26.0–34.0)
MCHC: 33.4 g/dL (ref 30.0–36.0)
MCV: 81.2 fL (ref 78.0–100.0)
Platelets: 296 10*3/uL (ref 150–400)
RBC: 5.05 MIL/uL (ref 4.22–5.81)
RDW: 12.2 % (ref 11.5–15.5)
WBC: 6.1 10*3/uL (ref 4.0–10.5)

## 2016-03-27 MED ORDER — SODIUM CHLORIDE 0.9 % IV BOLUS (SEPSIS)
1000.0000 mL | Freq: Once | INTRAVENOUS | Status: AC
  Administered 2016-03-27: 1000 mL via INTRAVENOUS

## 2016-03-27 NOTE — ED Notes (Signed)
Pt comes here today with several written complaints that have been going on for about 20 days:  "My mouth is dry My knee and arm tired, fatigue I am so tired In the night my saliva is mixed with blood (maybe my gums) My body is fast reduce and unbalanced I drink too much water"

## 2016-03-27 NOTE — ED Notes (Signed)
Patient states went to urgent care today and his CBG was 440 there.   Urgent care sent patient here.   Patient states he has had increased thirst, tiredness, frequent urination and that's why he went to urgent care.

## 2016-03-27 NOTE — ED Notes (Signed)
Pt being transferred to ED via POV for elevated BS level.  Pt is A&O w/ VSS.  Report was called to the ED First RN, [place].

## 2016-03-27 NOTE — ED Notes (Signed)
Pt left at this time with all belongings.  

## 2016-03-27 NOTE — ED Notes (Signed)
CBG 381 

## 2016-03-27 NOTE — ED Provider Notes (Signed)
CSN: [id number]     Arrival date & time 03/27/16  1256 History   First MD Initiated Contact with Patient 03/27/16 1317     Chief Complaint  Patient presents with  . Fatigue   (Consider location/radiation/quality/duration/timing/severity/associated sxs/prior Treatment) Patient is a 55 y.o. male presenting with weakness. The history is provided by the patient.  Weakness This is a new problem. The current episode started more than 1 week ago (mult sx for 44mo., fatigue, saliva thick and dry/). The problem has been gradually worsening. Pertinent negatives include no chest pain, no abdominal pain, no headaches and no shortness of breath.    Past Medical History  Diagnosis Date  . Hypertension 2010  . Diabetes mellitus without complication (HCC) 2010    Diet controlled  . Laceration of right wrist 08/25/2015    Followed by hand surgeon--Dr. Ashley RoyaltyMatthews, though reportedly no tendon injury--later required surgery for apparent tendon injury with Dr. Ashley RoyaltyMatthews  . Hyperlipidemia 2010    Has taken fish oil capsules in past   Past Surgical History  Procedure Laterality Date  . Repair, right wrist laceration with tendon involvement Right     Had laceration repair on 08/25/2015, but later recognized tendon injury and second surgery a week later.   Family History  Problem Relation Age of Onset  . Heart disease Mother     Possibly MI  . Hypertension Mother    Social History  Substance Use Topics  . Smoking status: Never Smoker   . Smokeless tobacco: Never Used  . Alcohol Use: No    Review of Systems  Constitutional: Positive for fatigue and unexpected weight change. Negative for fever.  Respiratory: Negative for shortness of breath and wheezing.   Cardiovascular: Negative for chest pain.  Gastrointestinal: Negative for abdominal pain.  Endocrine: Positive for polydipsia.  Neurological: Positive for weakness. Negative for headaches.  All other systems reviewed and are  negative.   Allergies  Review of patient's allergies indicates no known allergies.  Home Medications   Prior to Admission medications   Medication Sig Start Date End Date Taking? Authorizing Provider  lisinopril (PRINIVIL,ZESTRIL) 20 MG tablet Take 1 tablet (20 mg total) by mouth daily. For blood pressure 11/28/15  Yes Julieanne MansonElizabeth Mulberry, MD   Meds Ordered and Administered this Visit  Medications - No data to display  BP 125/81 mmHg  Pulse 60  Temp(Src) 97.8 F (36.6 C) (Oral)  Resp 18  SpO2 96% No data found.   Physical Exam  Constitutional: He is oriented to person, place, and time. He appears well-developed and well-nourished.  HENT:  Head: Normocephalic.  Eyes: Conjunctivae are normal. Pupils are equal, round, and reactive to light.  Neck: Normal range of motion. Neck supple.  Cardiovascular: Normal rate, normal heart sounds and intact distal pulses.   Pulmonary/Chest: Effort normal and breath sounds normal.  Abdominal: Soft. Bowel sounds are normal. He exhibits no mass. There is no tenderness.  Musculoskeletal: He exhibits no edema.  Lymphadenopathy:    He has no cervical adenopathy.  Neurological: He is alert and oriented to person, place, and time.  Skin: Skin is warm and dry.  Nursing note and vitals reviewed.   ED Course  Procedures (including critical care time)  Labs Review Labs Reviewed  POCT URINALYSIS DIP (DEVICE) - Abnormal; Notable for the following:    Glucose, UA 500 (*)    All other components within normal limits  POCT I-STAT, CHEM 8 - Abnormal; Notable for the following:    Chloride  96 (*)    Glucose, Bld 440 (*)    All other components within normal limits   bs 440 Imaging Review No results found.   Visual Acuity Review  Right Eye Distance:   Left Eye Distance:   Bilateral Distance:    Right Eye Near:   Left Eye Near:    Bilateral Near:         MDM   1. Uncontrolled type 2 diabetes mellitus without complication, without  long-term current use of insulin (HCC)    Sent for care of bs 440, medical f/u.    Linna Hoff, MD 03/27/16 [phone]

## 2016-03-27 NOTE — ED Provider Notes (Signed)
CSN: [id number]     Arrival date & time 03/27/16  1432 History   First MD Initiated Contact with Patient 03/27/16 1552     Chief Complaint  Patient presents with  . Hyperglycemia   HPI   Craig Miller is a 55 y.o. male PMH significant for HTN, DM (diet controlled) presenting with hyperglycemia. He was sent from urgent care for further management. He endorsed 1 month history of feeling fatigued, increased urination, lethargy. He denies fevers, chills, headaches, chest pain, shortness of breath, abdominal pain, nausea, vomiting, diarrhea.  Past Medical History  Diagnosis Date  . Hypertension 2010  . Diabetes mellitus without complication (HCC) 2010    Diet controlled  . Laceration of right wrist 08/25/2015    Followed by hand surgeon--Dr. Ashley RoyaltyMatthews, though reportedly no tendon injury--later required surgery for apparent tendon injury with Dr. Ashley RoyaltyMatthews  . Hyperlipidemia 2010    Has taken fish oil capsules in past   Past Surgical History  Procedure Laterality Date  . Repair, right wrist laceration with tendon involvement Right     Had laceration repair on 08/25/2015, but later recognized tendon injury and second surgery a week later.   Family History  Problem Relation Age of Onset  . Heart disease Mother     Possibly MI  . Hypertension Mother    Social History  Substance Use Topics  . Smoking status: Never Smoker   . Smokeless tobacco: Never Used  . Alcohol Use: No    Review of Systems  Ten systems are reviewed and are negative for acute change except as noted in the HPI  Allergies  Review of patient's allergies indicates no known allergies.  Home Medications   Prior to Admission medications   Medication Sig Start Date End Date Taking? Authorizing Provider  lisinopril (PRINIVIL,ZESTRIL) 20 MG tablet Take 1 tablet (20 mg total) by mouth daily. For blood pressure 11/28/15  Yes Julieanne MansonElizabeth Mulberry, MD   BP 120/81 mmHg  Pulse 51  Temp(Src) 97.8 F (36.6 C) (Oral)   Resp 16  Ht 5\' 4"  (1.626 m)  Wt 65.772 kg  BMI 24.88 kg/m2  SpO2 100% Physical Exam  Constitutional: He appears well-developed and well-nourished. No distress.  HENT:  Head: Normocephalic and atraumatic.  Mouth/Throat: Oropharynx is clear and moist. No oropharyngeal exudate.  Eyes: Conjunctivae are normal. Pupils are equal, round, and reactive to light. Right eye exhibits no discharge. Left eye exhibits no discharge. No scleral icterus.  Neck: No tracheal deviation present.  Cardiovascular: Regular rhythm, normal heart sounds and intact distal pulses.  Exam reveals no gallop and no friction rub.   No murmur heard. Bradycardia  Pulmonary/Chest: Effort normal and breath sounds normal. No respiratory distress. He has no wheezes. He has no rales. He exhibits no tenderness.  Abdominal: Soft. Bowel sounds are normal. He exhibits no distension and no mass. There is no tenderness. There is no rebound and no guarding.  Musculoskeletal: He exhibits no edema.  Lymphadenopathy:    He has no cervical adenopathy.  Neurological: He is alert. Coordination normal.  Skin: Skin is warm and dry. No rash noted. He is not diaphoretic. No erythema.  Psychiatric: He has a normal mood and affect. His behavior is normal.  Nursing note and vitals reviewed.   ED Course  Procedures  Labs Review Labs Reviewed  BASIC METABOLIC PANEL - Abnormal; Notable for the following:    Sodium 133 (*)    Chloride 98 (*)    Glucose, Bld 507 (*)  All other components within normal limits  CBC  URINALYSIS, ROUTINE W REFLEX MICROSCOPIC (NOT AT [facility])  CBG MONITORING, ED    EKG Interpretation   Date/Time:  Tuesday Mar 27 2016 19:30:45 EDT Ventricular Rate:  49 PR Interval:  162 QRS Duration: 103 QT Interval:  489 QTC Calculation: 441 R Axis:   22 Text Interpretation:  Sinus bradycardia Abnormal R-wave progression, early  transition No old tracing to compare Confirmed by [facility]  MD, MARTHA  [phone]) on 03/27/2016  7:50:30 PM      MDM   Final diagnoses:  Hyperglycemia   Patient with sinus bradycardia, VSS otherwise stable. Patient sent from urgent care with a blood sugar of 440. He denies chest pain, abdominal pain, headaches, shortness of breath, nausea/vomiting.  BMP unremarkable other than hyperglycemia of 507. CBC unremarkable. Urinalysis at urgent care demonstrated no ketones. Patient received 2 L of IV fluid hyperglycemic at 270. Patient states he's feeling much better and is requesting to go home. Patient has follow-up with primary care provider within one week. Discussed return precautions. Patient in understanding and agreement with the plan.  Melton Krebs, PA-C 03/27/16 2002  Jerelyn Scott, MD 03/27/16 2004

## 2016-03-27 NOTE — Discharge Instructions (Signed)
Mr. Craig Miller,  Nice meeting you! Please follow-up with your primary care provider. If you do not have one, please call the number listed in "additional information" section. Return to the emergency department if you develop headaches, chest pain, shortness of breath, abdominal pain, nausea/vomiting, new/worsening symptoms. Feel better soon!  S. Lane HackerNicole Lenzi Marmo, PA-C Hyperglycemia Hyperglycemia occurs when the glucose (sugar) in your blood is too high. Hyperglycemia can happen for many reasons, but it most often happens to people who do not know they have diabetes or are not managing their diabetes properly.  CAUSES  Whether you have diabetes or not, there are other causes of hyperglycemia. Hyperglycemia can occur when you have diabetes, but it can also occur in other situations that you might not be as aware of, such as: Diabetes  If you have diabetes and are having problems controlling your blood glucose, hyperglycemia could occur because of some of the following reasons:  Not following your meal plan.  Not taking your diabetes medications or not taking it properly.  Exercising less or doing less activity than you normally do.  Being sick. Pre-diabetes  This cannot be ignored. Before people develop Type 2 diabetes, they almost always have "pre-diabetes." This is when your blood glucose levels are higher than normal, but not yet high enough to be diagnosed as diabetes. Research has shown that some long-term damage to the body, especially the heart and circulatory system, may already be occurring during pre-diabetes. If you take action to manage your blood glucose when you have pre-diabetes, you may delay or prevent Type 2 diabetes from developing. Stress  If you have diabetes, you may be "diet" controlled or on oral medications or insulin to control your diabetes. However, you may find that your blood glucose is higher than usual in the hospital whether you have diabetes or not. This  is often referred to as "stress hyperglycemia." Stress can elevate your blood glucose. This happens because of hormones put out by the body during times of stress. If stress has been the cause of your high blood glucose, it can be followed regularly by your caregiver. That way he/she can make sure your hyperglycemia does not continue to get worse or progress to diabetes. Steroids  Steroids are medications that act on the infection fighting system (immune system) to block inflammation or infection. One side effect can be a rise in blood glucose. Most people can produce enough extra insulin to allow for this rise, but for those who cannot, steroids make blood glucose levels go even higher. It is not unusual for steroid treatments to "uncover" diabetes that is developing. It is not always possible to determine if the hyperglycemia will go away after the steroids are stopped. A special blood test called an A1c is sometimes done to determine if your blood glucose was elevated before the steroids were started. SYMPTOMS  Thirsty.  Frequent urination.  Dry mouth.  Blurred vision.  Tired or fatigue.  Weakness.  Sleepy.  Tingling in feet or leg. DIAGNOSIS  Diagnosis is made by monitoring blood glucose in one or all of the following ways:  A1c test. This is a chemical found in your blood.  Fingerstick blood glucose monitoring.  Laboratory results. TREATMENT  First, knowing the cause of the hyperglycemia is important before the hyperglycemia can be treated. Treatment may include, but is not be limited to:  Education.  Change or adjustment in medications.  Change or adjustment in meal plan.  Treatment for an illness, infection,  etc.  More frequent blood glucose monitoring.  Change in exercise plan.  Decreasing or stopping steroids.  Lifestyle changes. HOME CARE INSTRUCTIONS   Test your blood glucose as directed.  Exercise regularly. Your caregiver will give you instructions  about exercise. Pre-diabetes or diabetes which comes on with stress is helped by exercising.  Eat wholesome, balanced meals. Eat often and at regular, fixed times. Your caregiver or nutritionist will give you a meal plan to guide your sugar intake.  Being at an ideal weight is important. If needed, losing as little as 10 to 15 pounds may help improve blood glucose levels. SEEK MEDICAL CARE IF:   You have questions about medicine, activity, or diet.  You continue to have symptoms (problems such as increased thirst, urination, or weight gain). SEEK IMMEDIATE MEDICAL CARE IF:   You are vomiting or have diarrhea.  Your breath smells fruity.  You are breathing faster or slower.  You are very sleepy or incoherent.  You have numbness, tingling, or pain in your feet or hands.  You have chest pain.  Your symptoms get worse even though you have been following your caregiver's orders.  If you have any other questions or concerns.   This information is not intended to replace advice given to you by your health care provider. Make sure you discuss any questions you have with your health care provider.   Document Released: 04/17/2001 Document Revised: 01/14/2012 Document Reviewed: 06/28/2015 Elsevier Interactive Patient Education [employer]

## 2016-03-28 ENCOUNTER — Encounter: Payer: Self-pay | Admitting: Internal Medicine

## 2016-03-28 ENCOUNTER — Ambulatory Visit (INDEPENDENT_AMBULATORY_CARE_PROVIDER_SITE_OTHER): Payer: Self-pay | Admitting: Internal Medicine

## 2016-03-28 VITALS — BP 138/90 | HR 61 | Temp 98.4°F | Resp 18 | Ht 63.0 in | Wt 131.0 lb

## 2016-03-28 DIAGNOSIS — E1165 Type 2 diabetes mellitus with hyperglycemia: Secondary | ICD-10-CM

## 2016-03-28 DIAGNOSIS — E785 Hyperlipidemia, unspecified: Secondary | ICD-10-CM

## 2016-03-28 DIAGNOSIS — I10 Essential (primary) hypertension: Secondary | ICD-10-CM

## 2016-03-28 LAB — GLUCOSE, POCT (MANUAL RESULT ENTRY): POC GLUCOSE: 578 mg/dL — AB (ref 70–99)

## 2016-03-28 MED ORDER — GLUCOSE BLOOD VI STRP: ORAL_STRIP | Status: DC

## 2016-03-28 MED ORDER — GLIPIZIDE 5 MG PO TABS: 5.0000 mg | ORAL_TABLET | Freq: Two times a day (BID) | ORAL | Status: DC

## 2016-03-28 MED ORDER — BLOOD GLUCOSE MONITOR KIT
PACK | Status: DC
Start: 2016-03-28 — End: 2023-02-28

## 2016-03-28 MED ORDER — METFORMIN HCL ER 500 MG PO TB24: ORAL_TABLET | ORAL | Status: DC

## 2016-03-28 NOTE — Patient Instructions (Signed)
For the first three days of medicine, just take Metformin in the morning.  If you are doing okay with it, add the second dose with your evening meal Take the Glipizide twice daily with meals starting immediately.

## 2016-03-28 NOTE — Progress Notes (Signed)
   Subjective:    Patient ID: Craig Miller, male    DOB: 05/03/1961, 55 y.o.   MRN: [id number]  HPI   1.  DM Type 2:  Fatigue since Easter.  Very thirsty and urinating a lot.  Getting up at night multiple times.  Not clear why he was not getting signed up with the orange card, which he states delayed his follow up from January.  Sugar was 507 in the ED.  A1C in January was 7.4% Previously, changed his diet and was able to control that way.  As above, was to come back to start on medication after his January visit, but not clear why did not get orange card.     2.  Discussed elevated cholesterol from January.  Holding on starting med until have DM under control and feeling better.    3.  Essential Hypertension:  Did not take Lisinopril yesterday as he thought is bp was too low at 125 systolic. Meds: Lisinopril 20 mg daily  No Known Allergies    Review of Systems     Objective:   Physical Exam  NAD Lungs:  CTA CV:  RRR with  Normal S1 and S2, no S3, S4 or murmur, radial pulses normal and equal       Assessment & Plan:  1.  DM type 2:  Has gradually lost control on diet alone.  Start Metformin ER 500 mg twice daily and Glipizide 5 mg twice daily.  Long discussion regarding diet and physical activity to treat as well. [facility]com[facility]  He will drop by Friday with blood sugars if still running in 400-500 range. Follow up in 2 weeks otherwise  2.  Essential Hypertension:  To avoid skipping bp med when in normal range.  3.  Hyperlipidemia:  Lifestyle changes discussed as well as possibility of meds once DM controlled

## 2016-04-13 ENCOUNTER — Ambulatory Visit: Payer: Self-pay | Admitting: Family Medicine

## 2016-04-16 ENCOUNTER — Ambulatory Visit (INDEPENDENT_AMBULATORY_CARE_PROVIDER_SITE_OTHER): Payer: Self-pay | Admitting: Internal Medicine

## 2016-04-16 ENCOUNTER — Encounter: Payer: Self-pay | Admitting: Internal Medicine

## 2016-04-16 VITALS — BP 132/70 | HR 60 | Resp 20 | Ht 63.0 in | Wt 136.0 lb

## 2016-04-16 DIAGNOSIS — E1165 Type 2 diabetes mellitus with hyperglycemia: Secondary | ICD-10-CM

## 2016-04-16 DIAGNOSIS — Z23 Encounter for immunization: Secondary | ICD-10-CM

## 2016-04-16 LAB — GLUCOSE, POCT (MANUAL RESULT ENTRY): POC GLUCOSE: 95 mg/dL (ref 70–99)

## 2016-04-16 NOTE — Progress Notes (Signed)
   Subjective:    Patient ID: Craig Miller, male    DOB: [birth year], 55 y.o.   MRN: [id number]  HPI  Following up for DM with sugars 2 weeks ago or more in the 500s.  Previously controlled with diet.  1.  DM Type 2:  Taking both Metformin ER 500 mg twice daily and Glipizide 5 mg twice daily with meals. Has been checking sugars twice daily. Sugars generally 120-130 fasting.   Sugars in the evening around 150.   Has not had a sugar below 70.   Has noticed vision to be much blurrier since sugars better, though this is only with near vision.  His distance vision is actually improved. He has not had an eye check in 7 years at least. Has not had a Pneumovax in the past.  2.  Essential Hypertension:  Takes lisinopril at night daily.   Current outpatient prescriptions:  .  blood glucose meter kit and supplies KIT, Twice daily blood glucose checks, Disp: 1 each, Rfl: 0 .  glipiZIDE (GLUCOTROL) 5 MG tablet, Take 1 tablet (5 mg total) by mouth 2 (two) times daily before a meal., Disp: 60 tablet, Rfl: 11 .  glucose blood (ACCU-CHEK AVIVA) test strip, Twice daily glucose test strips, Disp: 100 each, Rfl: 12 .  lisinopril (PRINIVIL,ZESTRIL) 20 MG tablet, Take 1 tablet (20 mg total) by mouth daily. For blood pressure, Disp: 30 tablet, Rfl: 11 .  metFORMIN (GLUCOPHAGE-XR) 500 MG 24 hr tablet, 1 tab by mouth twice daily, Disp: 60 tablet, Rfl: 11   No Known Allergies   Review of Systems     Objective:   Physical Exam  NAD Lungs:  CTA CV:  RRR without murmur or rub, radial pulses normal and equal Abd:  S, NT, No HSM or mass, + BS LE:  No edema      Assessment & Plan:  1.  DM type 2:  Much improved control from just 2 weeks ago.   CPM with meds  Pneumovax today Referral for diabetic eye exam.

## 2016-07-17 ENCOUNTER — Encounter: Payer: Self-pay | Admitting: Internal Medicine

## 2016-07-17 ENCOUNTER — Ambulatory Visit (INDEPENDENT_AMBULATORY_CARE_PROVIDER_SITE_OTHER): Payer: PRIVATE HEALTH INSURANCE | Admitting: Internal Medicine

## 2016-07-17 VITALS — BP 136/84 | HR 70 | Resp 18 | Ht 63.5 in | Wt 143.0 lb

## 2016-07-17 DIAGNOSIS — E1165 Type 2 diabetes mellitus with hyperglycemia: Secondary | ICD-10-CM

## 2016-07-17 DIAGNOSIS — E785 Hyperlipidemia, unspecified: Secondary | ICD-10-CM

## 2016-07-17 DIAGNOSIS — I10 Essential (primary) hypertension: Secondary | ICD-10-CM

## 2016-07-17 LAB — GLUCOSE, POCT (MANUAL RESULT ENTRY): POC GLUCOSE: 145 mg/dL — AB (ref 70–99)

## 2016-07-17 NOTE — Progress Notes (Signed)
   Subjective:    Patient ID: Craig Miller, male    DOB: [birth year], 55 y.o.   MRN: [id number]  HPI   1.  DM Type 2:  Has gained 7 lbs.  States feels better at this weight.  Did not bring glucose journal.  His sugars generally run 85-135.  These are all postprandial, 30-60 minutes.  Not clear why he always checks postprandial. Taking Metformin ER and Glipizide twice daily. Not physiically active on a daily basis. Checking feet nightly No numbness or tingling in feet or hands. Has not had eye exam.  Referred in June. Is not fasting today.    2.  Essential Hypertension:  Taking Lisinopril every day.  No problems  Current Meds  Medication Sig  . blood glucose meter kit and supplies KIT Twice daily blood glucose checks  . glipiZIDE (GLUCOTROL) 5 MG tablet Take 1 tablet (5 mg total) by mouth 2 (two) times daily before a meal.  . glucose blood (ACCU-CHEK AVIVA) test strip Twice daily glucose test strips  . lisinopril (PRINIVIL,ZESTRIL) 20 MG tablet Take 1 tablet (20 mg total) by mouth daily. For blood pressure  . metFORMIN (GLUCOPHAGE-XR) 500 MG 24 hr tablet 1 tab by mouth twice daily    No Known Allergies Review of Systems     Objective:   Physical Exam  NAD Lungs:  CTA CV:  RRR without murmur or rub, radial and DP pulses normal and equal LE:  No edema 2nd toe bilaterally with hammertoe-mild. 10 g monofilament  WNL No foot lesions     Assessment & Plan:  1.  DM Type 2:  Patient to return fasting in next couple of weeks for A1C and FLP.   Sounds like he is doing well with diabetic control Recommend flu vaccine with upcoming health fair. Rerefer for DM eye exam  2.  Essential Hypertension:  Controlled.  3.  Hyperlipidemia:  FLP in 1-2 weeks.  If not improved, will need to start statin.  Follow up in 3-4 months for CPE

## 2016-07-17 NOTE — Patient Instructions (Signed)
Check your sugars twice a day before meals and write them down--bring them in with each visit.

## 2016-08-21 ENCOUNTER — Other Ambulatory Visit: Payer: Self-pay

## 2016-08-23 ENCOUNTER — Other Ambulatory Visit (INDEPENDENT_AMBULATORY_CARE_PROVIDER_SITE_OTHER): Payer: Self-pay

## 2016-08-23 DIAGNOSIS — E1165 Type 2 diabetes mellitus with hyperglycemia: Secondary | ICD-10-CM

## 2016-08-23 DIAGNOSIS — E785 Hyperlipidemia, unspecified: Secondary | ICD-10-CM

## 2016-08-24 ENCOUNTER — Ambulatory Visit (INDEPENDENT_AMBULATORY_CARE_PROVIDER_SITE_OTHER): Payer: Self-pay

## 2016-08-24 VITALS — BP 138/88 | HR 82

## 2016-08-24 DIAGNOSIS — I10 Essential (primary) hypertension: Secondary | ICD-10-CM

## 2016-08-24 LAB — COMPREHENSIVE METABOLIC PANEL
A/G RATIO: 1.5 (ref 1.2–2.2)
ALK PHOS: 65 IU/L (ref 39–117)
ALT: 23 IU/L (ref 0–44)
AST: 19 IU/L (ref 0–40)
Albumin: 4.4 g/dL (ref 3.5–5.5)
BILIRUBIN TOTAL: 0.6 mg/dL (ref 0.0–1.2)
BUN/Creatinine Ratio: 16 (ref 9–20)
BUN: 14 mg/dL (ref 6–24)
CHLORIDE: 97 mmol/L (ref 96–106)
CO2: 28 mmol/L (ref 18–29)
Calcium: 9.4 mg/dL (ref 8.7–10.2)
Creatinine, Ser: 0.87 mg/dL (ref 0.76–1.27)
GFR calc Af Amer: 114 mL/min/{1.73_m2} (ref >59)
GFR calc non Af Amer: 99 mL/min/{1.73_m2} (ref >59)
Globulin, Total: 3 g/dL (ref 1.5–4.5)
Glucose: 108 mg/dL — ABNORMAL HIGH (ref 65–99)
POTASSIUM: 5.2 mmol/L (ref 3.5–5.2)
Sodium: 136 mmol/L (ref 134–144)
Total Protein: 7.4 g/dL (ref 6.0–8.5)

## 2016-08-24 LAB — LIPID PANEL W/O CHOL/HDL RATIO
Cholesterol, Total: 183 mg/dL (ref 100–199)
HDL: 36 mg/dL — AB (ref >39)
LDL Calculated: 129 mg/dL — ABNORMAL HIGH (ref 0–99)
TRIGLYCERIDES: 88 mg/dL (ref 0–149)
VLDL Cholesterol Cal: 18 mg/dL (ref 5–40)

## 2016-08-24 LAB — MICROALBUMIN, URINE: MICROALBUM., U, RANDOM: 32 ug/mL

## 2016-08-24 LAB — HGB A1C W/O EAG: Hgb A1c MFr Bld: 6.1 % — ABNORMAL HIGH (ref 4.8–5.6)

## 2016-11-10 ENCOUNTER — Other Ambulatory Visit: Payer: Self-pay | Admitting: Internal Medicine

## 2016-12-04 ENCOUNTER — Telehealth: Payer: Self-pay | Admitting: Internal Medicine

## 2016-12-04 ENCOUNTER — Ambulatory Visit (INDEPENDENT_AMBULATORY_CARE_PROVIDER_SITE_OTHER): Payer: Self-pay | Admitting: Internal Medicine

## 2016-12-04 ENCOUNTER — Encounter: Payer: Self-pay | Admitting: Internal Medicine

## 2016-12-04 VITALS — BP 142/84 | HR 65 | Resp 12 | Ht 63.0 in | Wt 141.0 lb

## 2016-12-04 DIAGNOSIS — E119 Type 2 diabetes mellitus without complications: Secondary | ICD-10-CM

## 2016-12-04 LAB — GLUCOSE, POCT (MANUAL RESULT ENTRY): POC Glucose: 90 mg/dl (ref 70–99)

## 2016-12-04 NOTE — Telephone Encounter (Signed)
Patient needs refill for lisinopril 20 mg tablet.
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
## 2016-12-05 ENCOUNTER — Other Ambulatory Visit: Payer: Self-pay

## 2016-12-05 MED ORDER — LISINOPRIL 20 MG PO TABS: ORAL_TABLET | ORAL | 0 refills | Status: DC

## 2016-12-05 NOTE — Telephone Encounter (Signed)
Rx faxed to pharmacy  

## 2017-01-11 ENCOUNTER — Ambulatory Visit (INDEPENDENT_AMBULATORY_CARE_PROVIDER_SITE_OTHER): Payer: Self-pay | Admitting: Internal Medicine

## 2017-01-11 ENCOUNTER — Encounter: Payer: Self-pay | Admitting: Internal Medicine

## 2017-01-11 VITALS — BP 130/96 | HR 54 | Ht 64.0 in | Wt 139.0 lb

## 2017-01-11 DIAGNOSIS — E785 Hyperlipidemia, unspecified: Secondary | ICD-10-CM

## 2017-01-11 DIAGNOSIS — E119 Type 2 diabetes mellitus without complications: Secondary | ICD-10-CM

## 2017-01-11 DIAGNOSIS — I10 Essential (primary) hypertension: Secondary | ICD-10-CM

## 2017-01-11 DIAGNOSIS — Z0000 Encounter for general adult medical examination without abnormal findings: Secondary | ICD-10-CM

## 2017-01-11 DIAGNOSIS — E1165 Type 2 diabetes mellitus with hyperglycemia: Secondary | ICD-10-CM
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
## 2017-01-15 LAB — CBC WITH DIFFERENTIAL/PLATELET
BASOS ABS: 0 10*3/uL (ref 0.0–0.2)
Basos: 0 %
EOS (ABSOLUTE): 0.1 10*3/uL (ref 0.0–0.4)
EOS: 2 %
HEMOGLOBIN: 13.6 g/dL (ref 13.0–17.7)
Hematocrit: 42.3 % (ref 37.5–51.0)
IMMATURE GRANULOCYTES: 0 %
Immature Grans (Abs): 0 10*3/uL (ref 0.0–0.1)
LYMPHS ABS: 0.9 10*3/uL (ref 0.7–3.1)
Lymphs: 18 %
MCH: 27.2 pg (ref 26.6–33.0)
MCHC: 32.2 g/dL (ref 31.5–35.7)
MCV: 85 fL (ref 79–97)
MONOCYTES: 11 %
Monocytes Absolute: 0.6 10*3/uL (ref 0.1–0.9)
NEUTROS PCT: 69 %
Neutrophils Absolute: 3.6 10*3/uL (ref 1.4–7.0)
Platelets: 297 10*3/uL (ref 150–379)
RBC: 5 x10E6/uL (ref 4.14–5.80)
RDW: 13.8 % (ref 12.3–15.4)
WBC: 5.2 10*3/uL (ref 3.4–10.8)

## 2017-01-15 LAB — COMPREHENSIVE METABOLIC PANEL
ALK PHOS: 63 IU/L (ref 39–117)
ALT: 24 IU/L (ref 0–44)
AST: 19 IU/L (ref 0–40)
Albumin/Globulin Ratio: 1.5 (ref 1.2–2.2)
Albumin: 4.5 g/dL (ref 3.5–5.5)
BUN/Creatinine Ratio: 13 (ref 9–20)
BUN: 11 mg/dL (ref 6–24)
Bilirubin Total: 0.6 mg/dL (ref 0.0–1.2)
CALCIUM: 9.3 mg/dL (ref 8.7–10.2)
CO2: 25 mmol/L (ref 18–29)
CREATININE: 0.87 mg/dL (ref 0.76–1.27)
Chloride: 99 mmol/L (ref 96–106)
GFR calc Af Amer: 114 mL/min/{1.73_m2} (ref >59)
GFR, EST NON AFRICAN AMERICAN: 99 mL/min/{1.73_m2} (ref >59)
GLUCOSE: 78 mg/dL (ref 65–99)
Globulin, Total: 3 g/dL (ref 1.5–4.5)
Potassium: 4.9 mmol/L (ref 3.5–5.2)
Sodium: 140 mmol/L (ref 134–144)
Total Protein: 7.5 g/dL (ref 6.0–8.5)

## 2017-01-15 LAB — HIV ANTIBODY (ROUTINE TESTING W REFLEX): HIV Screen 4th Generation wRfx: NONREACTIVE

## 2017-01-15 LAB — LIPID PANEL W/O CHOL/HDL RATIO
CHOLESTEROL TOTAL: 180 mg/dL (ref 100–199)
HDL: 35 mg/dL — AB (ref >39)
LDL CALC: 122 mg/dL — AB (ref 0–99)
TRIGLYCERIDES: 115 mg/dL (ref 0–149)
VLDL CHOLESTEROL CAL: 23 mg/dL (ref 5–40)

## 2017-01-15 LAB — HEPATITIS C ANTIBODY

## 2017-01-15 LAB — HGB A1C W/O EAG: Hgb A1c MFr Bld: 6.3 % — ABNORMAL HIGH (ref 4.8–5.6)

## 2017-01-15 LAB — MICROALBUMIN / CREATININE URINE RATIO

## 2017-01-15 LAB — PSA: Prostate Specific Ag, Serum: 1.3 ng/mL (ref 0.0–4.0)

## 2017-01-17 MED ORDER — ATORVASTATIN CALCIUM 20 MG PO TABS: ORAL_TABLET | ORAL | 11 refills | Status: DC

## 2017-01-20 IMAGING — DX DG WRIST COMPLETE 3+V*R*
4 series · 4 of 4 positions shown · non-contrast
Comparison: None.

CLINICAL DATA: Recent work injury with wrist laceration, initial
encounter

EXAM:
RIGHT WRIST - COMPLETE 3+ VIEW

[x wrist pa right]
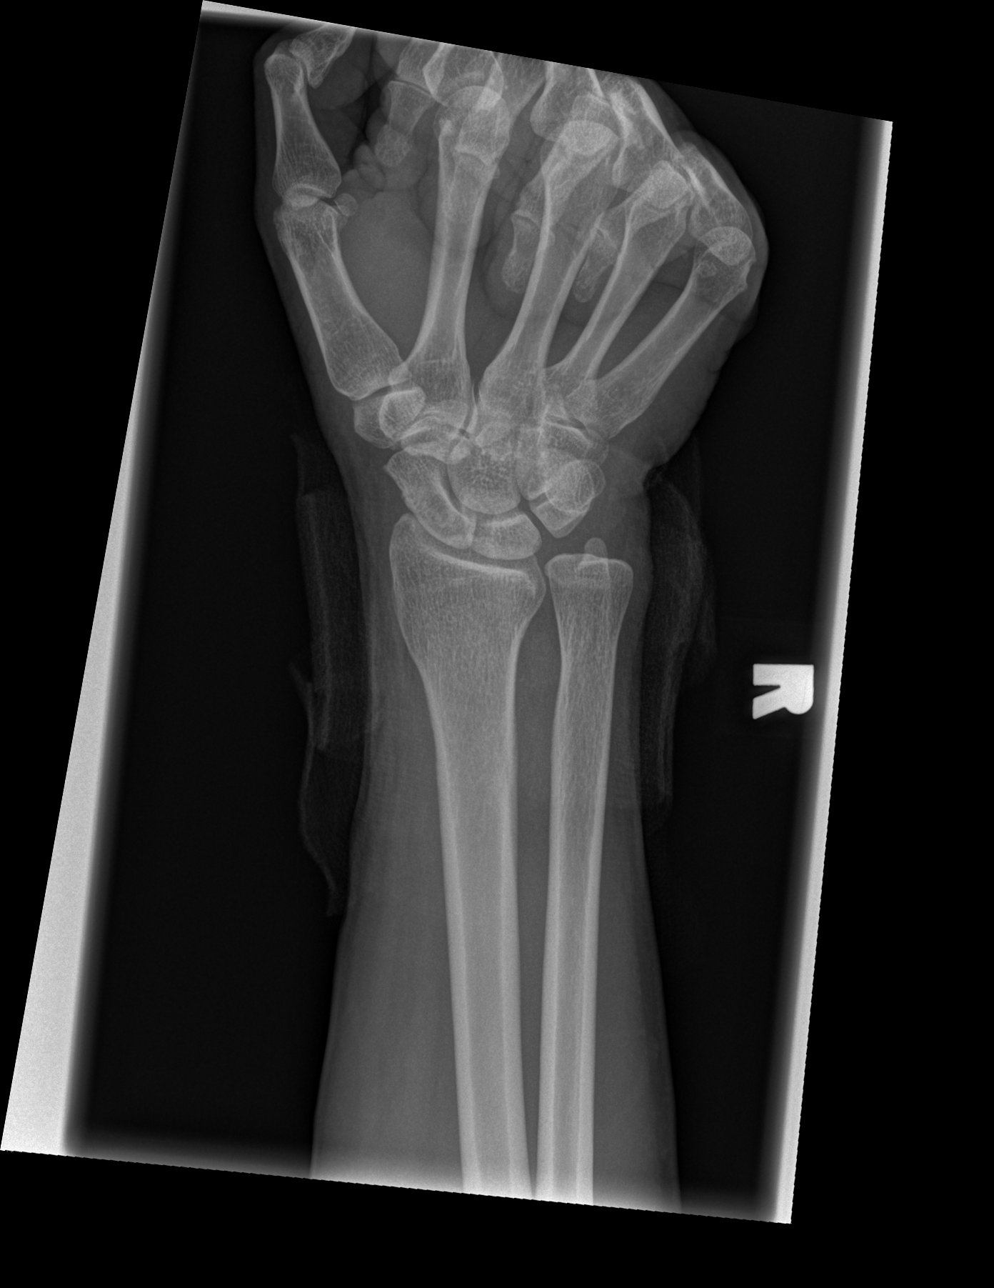

[x wrist obl right]
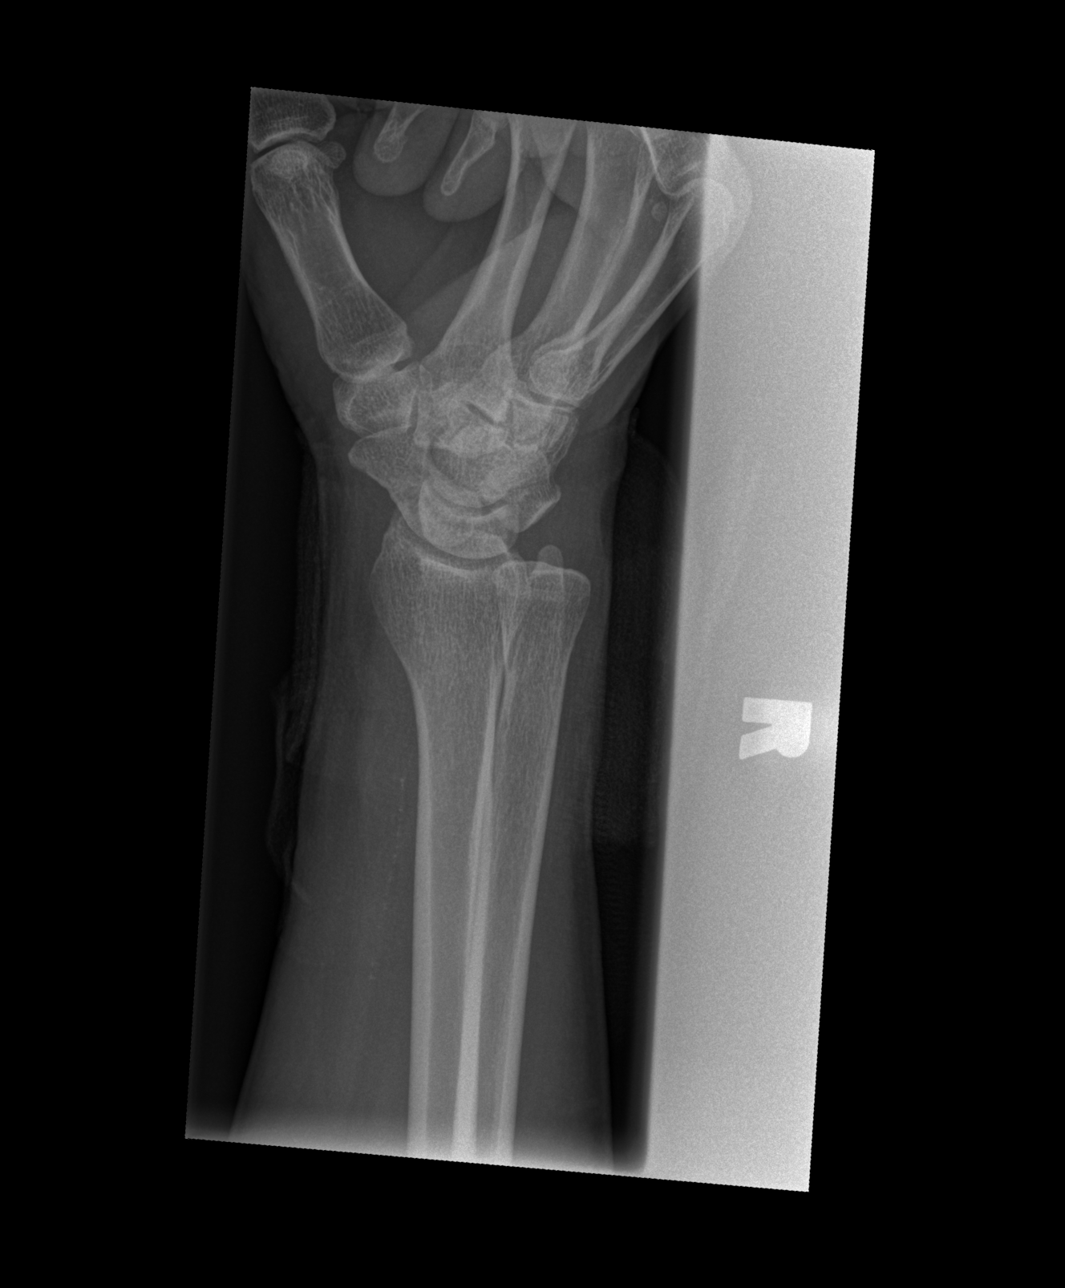

[x wrist lat right]
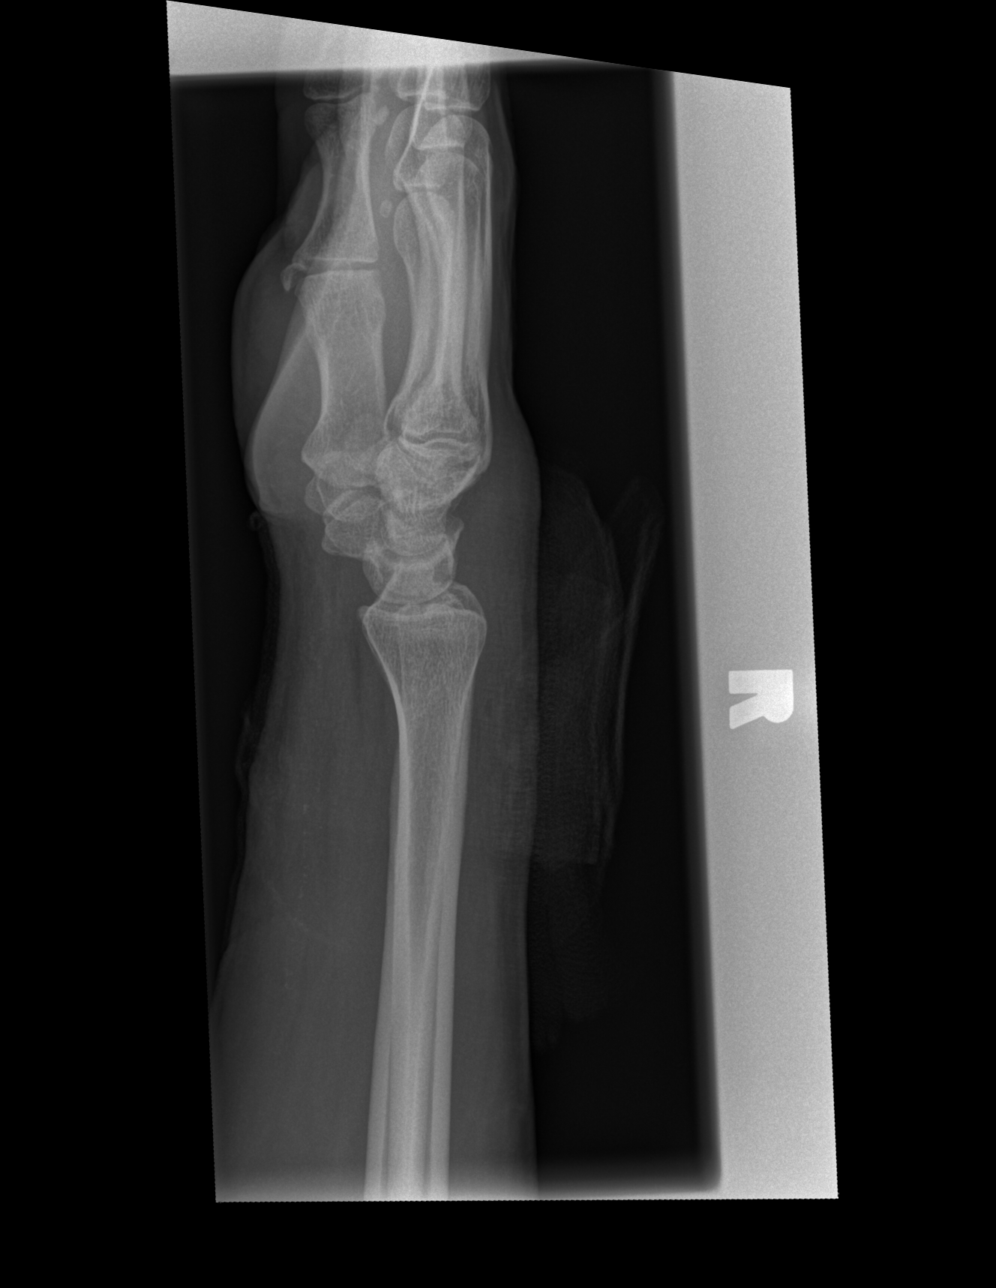

[x wrist navicular view right]
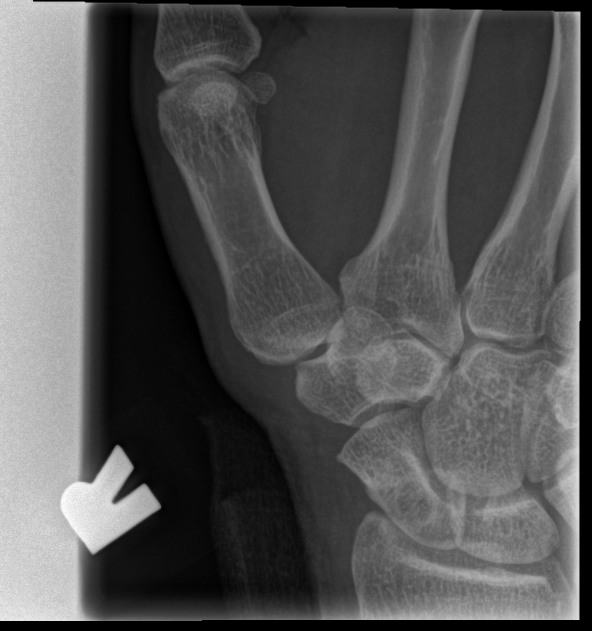

[4 of 4 positions shown; findings below may reference images not displayed]

FINDINGS: There is no evidence of fracture or dislocation. There is no
evidence of arthropathy or other focal bone abnormality. Soft
tissues are unremarkable.
IMPRESSION: No acute abnormality noted.

## 2017-02-11 ENCOUNTER — Other Ambulatory Visit (INDEPENDENT_AMBULATORY_CARE_PROVIDER_SITE_OTHER): Payer: Self-pay

## 2017-02-11 VITALS — BP 162/98 | HR 60

## 2017-02-11 DIAGNOSIS — I10 Essential (primary) hypertension: Secondary | ICD-10-CM

## 2017-02-12 ENCOUNTER — Encounter: Payer: Self-pay | Admitting: Internal Medicine

## 2017-02-12 ENCOUNTER — Ambulatory Visit (INDEPENDENT_AMBULATORY_CARE_PROVIDER_SITE_OTHER): Payer: Self-pay | Admitting: Internal Medicine

## 2017-02-12 VITALS — BP 148/94 | HR 60 | Resp 12 | Ht 64.0 in | Wt 140.0 lb

## 2017-02-12 DIAGNOSIS — E119 Type 2 diabetes mellitus without complications: Secondary | ICD-10-CM

## 2017-02-12 DIAGNOSIS — I10 Essential (primary) hypertension: Secondary | ICD-10-CM

## 2017-02-12 LAB — GLUCOSE, POCT (MANUAL RESULT ENTRY): POC Glucose: 108 mg/dl — AB (ref 70–99)

## 2017-04-23 NOTE — Progress Notes (Signed)
Left without being seen.

## 2020-02-06 ENCOUNTER — Ambulatory Visit: Payer: Self-pay

## 2022-12-11 ENCOUNTER — Encounter (HOSPITAL_COMMUNITY): Payer: Self-pay

## 2022-12-11 ENCOUNTER — Ambulatory Visit (HOSPITAL_COMMUNITY)
Admission: EM | Admit: 2022-12-11 | Discharge: 2022-12-11 | Disposition: A | Discharge status: Home / Self Care | Payer: 59 | Attending: Family Medicine | Admitting: Family Medicine

## 2022-12-11 DIAGNOSIS — T783XXA Angioneurotic edema, initial encounter: Secondary | ICD-10-CM

## 2022-12-11 NOTE — ED Triage Notes (Signed)
Pt is here for lip swelling x today

## 2022-12-11 NOTE — Discharge Instructions (Signed)
Stop taking lisinopril. Your lip swelling should resolve in the next 24-72 hours. If you feel this is worsening please proceed directly to the Emergency Department.  Call your doctor tomorrow to inform him or her of your symptoms and to ask about alternative medications to treat your blood pressure.
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
## 2022-12-17 ENCOUNTER — Ambulatory Visit: Payer: Self-pay | Admitting: Family Medicine

## 2023-01-08 ENCOUNTER — Ambulatory Visit (INDEPENDENT_AMBULATORY_CARE_PROVIDER_SITE_OTHER): Payer: 59 | Admitting: Family Medicine

## 2023-01-08 VITALS — BP 163/94 | HR 60 | Ht 64.0 in | Wt 146.5 lb

## 2023-01-08 DIAGNOSIS — Z13228 Encounter for screening for other metabolic disorders: Secondary | ICD-10-CM | POA: Diagnosis not present

## 2023-01-08 DIAGNOSIS — I10 Essential (primary) hypertension: Secondary | ICD-10-CM | POA: Diagnosis not present

## 2023-01-08 DIAGNOSIS — N3943 Post-void dribbling: Secondary | ICD-10-CM | POA: Diagnosis not present

## 2023-01-08 DIAGNOSIS — E1165 Type 2 diabetes mellitus with hyperglycemia: Secondary | ICD-10-CM

## 2023-01-08 DIAGNOSIS — Z7689 Persons encountering health services in other specified circumstances: Secondary | ICD-10-CM

## 2023-01-08 MED ORDER — AMLODIPINE BESYLATE 5 MG PO TABS: 5.0000 mg | ORAL_TABLET | Freq: Every day | ORAL | 3 refills | Status: DC

## 2023-01-08 MED ORDER — HYDROCHLOROTHIAZIDE 12.5 MG PO TABS: 12.5000 mg | ORAL_TABLET | Freq: Every day | ORAL | 3 refills | Status: DC

## 2023-01-08 MED ORDER — TAMSULOSIN HCL 0.4 MG PO CAPS: 0.4000 mg | ORAL_CAPSULE | Freq: Every day | ORAL | 3 refills | Status: DC

## 2023-01-08 NOTE — Progress Notes (Signed)
Subjective:  Patient ID: Craig Miller, male    DOB: Mar 10, 1961, 62 y.o.   MRN: [id number]  CC: New Patient  HPI:  Craig Miller is a very pleasant 62 y.o. male who presents today to establish care. Wants to discuss BP meds and urinary symptoms:  HTN: Was on lisinopril/HCTZ 20/12.5. Visited urgent care recently due to lip swelling, was told to stop lisinopril. Then visited [facility] who advised he take HCTZ 12.5 alone. Since then, has been having BP in [id number] systolic. Previously was well controlled in 120s-130s on combo agent. Also takes clonidine once nightly.  Urinary: Also experiencing urinary frequency at night. Concerned about having prostate problems. Wants to check PSA. Dribbles when he urinates, feels like he can't empty his bladder. Wakes up 3-4 times per night to urinate. Denies dysuria, hematuria. Denies back pain.   DM: Home CBGs run low 100s, on metformin 500 BID and glipizide '5mg'$  daily   Denies ever smoking, drinking  PMHx: Past Medical History:  Diagnosis Date   Diabetes mellitus without complication ([place]) [id number]   Diet controlled   Hyperlipidemia 2010   Has taken fish oil capsules in past   Hypertension 2010   Laceration of right wrist 08/25/2015   Followed by hand surgeon--Dr. Zigmund Daniel, though reportedly no tendon injury--later required surgery for apparent tendon injury with Dr. Zigmund Daniel    Surgical Hx: Past Surgical History:  Procedure Laterality Date   repair, right wrist laceration with tendon involvement Right    Had laceration repair on 08/25/2015, but later recognized tendon injury and second surgery a week later.    Family Hx: Family History  Problem Relation Age of Onset   Heart disease Mother        Possibly MI   Hypertension Mother       Medications:  Current Outpatient Medications:    amLODipine (NORVASC) 5 MG tablet, Take 1 tablet (5 mg total) by mouth daily., Disp: 90 tablet, Rfl: 3   blood glucose meter  kit and supplies KIT, Twice daily blood glucose checks, Disp: 1 each, Rfl: 0   cloNIDine (CATAPRES) 0.1 MG tablet, Take 0.1 mg by mouth at bedtime., Disp: , Rfl:    glipiZIDE (GLUCOTROL XL) 5 MG 24 hr tablet, Take 5 mg by mouth daily with breakfast., Disp: , Rfl:    glucose blood (ACCU-CHEK AVIVA) test strip, Twice daily glucose test strips, Disp: 100 each, Rfl: 12   metFORMIN (GLUCOPHAGE-XR) 500 MG 24 hr tablet, 1 tab by mouth twice daily, Disp: 60 tablet, Rfl: 11   Omega-3 Fatty Acids (FISH OIL PO), Take by mouth daily., Disp: , Rfl:    tamsulosin (FLOMAX) 0.4 MG CAPS capsule, Take 1 capsule (0.4 mg total) by mouth daily., Disp: 30 capsule, Rfl: 3   atorvastatin (LIPITOR) 20 MG tablet, 1/2 tab by mouth with evening meal daily (Patient not taking: Reported on 02/12/2017), Disp: 30 tablet, Rfl: 11   hydrochlorothiazide (HYDRODIURIL) 12.5 MG tablet, Take 1 tablet (12.5 mg total) by mouth daily., Disp: 30 tablet, Rfl: 3      Objective:  BP (!) 163/94   Pulse 60   Ht '5\' 4"'$  (1.626 m)   Wt 66.5 kg   SpO2 100%   BMI 25.15 kg/m  Vitals and nursing note reviewed  General: NAD, pleasant, able to participate in exam Cardiac: RRR, no murmurs auscultated Respiratory: CTAB, normal WOB Abdomen: soft, non-tender, non-distended, normoactive bowel sounds Extremities: warm and well perfused, no edema or cyanosis Skin: warm and  dry, no rashes noted Neuro: alert, no obvious focal deficits, speech normal Psych: Normal affect and mood   Assessment & Plan:  Essential hypertension Assessment & Plan: Stopped lisinopril due to angioedema.  Currently not well-controlled on HCTZ 12.5 mg daily.  Will add amlodipine 5 mg daily in addition to his HCTZ, instructed patient to wait a few days for this to kick in, and if after 1 week his blood pressure still elevated to take 2 tablets (10 mg total).  Either way, we will follow-up in 1 month.  Also checking A1c, lipid panel, and BMP as noted.  Orders: -      hydroCHLOROthiazide; Take 1 tablet (12.5 mg total) by mouth daily.  Dispense: 30 tablet; Refill: 3 -     amLODIPine Besylate; Take 1 tablet (5 mg total) by mouth daily.  Dispense: 90 tablet; Refill: 3  Type 2 diabetes mellitus with hyperglycemia, without long-term current use of insulin (HCC) -     Hemoglobin A1c  Urinary dribbling Assessment & Plan: Appears to have symptoms consistent with BPH given urinary dribbling and frequency at night.  Will check PSA per patient request and symptoms.  Feel he would benefit from Flomax trial, will follow-up for any improvement after 1 month.  Orders: -     PSA -     Tamsulosin HCl; Take 1 capsule (0.4 mg total) by mouth daily.  Dispense: 30 capsule; Refill: 3  Screening for metabolic disorder -     Lipid panel -     Basic metabolic panel  Encounter to establish care   Meds ordered this encounter  Medications   hydrochlorothiazide (HYDRODIURIL) 12.5 MG tablet    Sig: Take 1 tablet (12.5 mg total) by mouth daily.    Dispense:  30 tablet    Refill:  3   amLODipine (NORVASC) 5 MG tablet    Sig: Take 1 tablet (5 mg total) by mouth daily.    Dispense:  90 tablet    Refill:  3   tamsulosin (FLOMAX) 0.4 MG CAPS capsule    Sig: Take 1 capsule (0.4 mg total) by mouth daily.    Dispense:  30 capsule    Refill:  3   Return in about 1 month (around 02/08/2023) for BP check. August Albino, MD 01/08/2023, 2:19 PM PGY-1, [place]

## 2023-01-08 NOTE — Patient Instructions (Signed)
It was wonderful to see you today.  Please bring ALL of your medications with you to every visit.   Updates from today's visit:  For your blood pressure, please take amlodipine 5 mg (1 tablet) daily.  This medicine may take 4 to 5 days to kick in.  If your blood pressure continues to be elevated after 1 week of using this, you can try taking 2 tablets (10 mg) daily.  Please be sure to follow-up in 1 month to make sure that your blood pressure is well-controlled on this For your urinary concerns, please try taking Flomax (tamsulosin) daily.  We are checking your PSA today, I will give you a call if this is abnormal We are also checking your A1c, lipid panel entheses cholesterol), and basic metabolic panel (electrolytes and kidney function).  I will give you a call if anything is abnormal or requiring treatment.  Otherwise, you will receive a letter in the mail.  Please follow up in 1 month  Thank you for choosing [facility].   Please call [phone] with any questions about today's appointment.  Please be sure to schedule follow up at the front  desk before you leave today.   August Albino, MD  Family Medicine

## 2023-01-08 NOTE — Assessment & Plan Note (Signed)
Appears to have symptoms consistent with BPH given urinary dribbling and frequency at night.  Will check PSA per patient request and symptoms.  Feel he would benefit from Flomax trial, will follow-up for any improvement after 1 month.

## 2023-01-08 NOTE — Assessment & Plan Note (Addendum)
Stopped lisinopril due to angioedema.  Currently not well-controlled on HCTZ 12.5 mg daily.  Will add amlodipine 5 mg daily in addition to his HCTZ, instructed patient to wait a few days for this to kick in, and if after 1 week his blood pressure still elevated to take 2 tablets (10 mg total).  Either way, we will follow-up in 1 month.  Also checking A1c, lipid panel, and BMP as noted.

## 2023-01-09 LAB — BASIC METABOLIC PANEL
BUN/Creatinine Ratio: 16 (ref 9–20)
BUN: 18 mg/dL (ref 6–24)
CO2: 25 mmol/L (ref 20–29)
Calcium: 9.2 mg/dL (ref 8.7–10.2)
Chloride: 102 mmol/L (ref 96–106)
Creatinine, Ser: 1.12 mg/dL (ref 0.76–1.27)
Glucose: 185 mg/dL — ABNORMAL HIGH (ref 70–99)
Potassium: 3.9 mmol/L (ref 3.5–5.2)
Sodium: 141 mmol/L (ref 134–144)
eGFR: 76 mL/min/{1.73_m2} (ref >59)

## 2023-01-09 LAB — LIPID PANEL
Chol/HDL Ratio: 5.6 ratio — ABNORMAL HIGH (ref 0.0–5.0)
Cholesterol, Total: 178 mg/dL (ref 100–199)
HDL: 32 mg/dL — ABNORMAL LOW (ref >39)
LDL Chol Calc (NIH): 111 mg/dL — ABNORMAL HIGH (ref 0–99)
Triglycerides: 200 mg/dL — ABNORMAL HIGH (ref 0–149)
VLDL Cholesterol Cal: 35 mg/dL (ref 5–40)

## 2023-01-09 LAB — HEMOGLOBIN A1C
Est. average glucose Bld gHb Est-mCnc: 183 mg/dL
Hgb A1c MFr Bld: 8 % — ABNORMAL HIGH (ref 4.8–5.6)

## 2023-01-09 LAB — PSA: Prostate Specific Ag, Serum: 0.9 ng/mL (ref 0.0–4.0)

## 2023-01-16 ENCOUNTER — Other Ambulatory Visit: Payer: Self-pay | Admitting: Family Medicine

## 2023-01-16 NOTE — Progress Notes (Signed)
erroneous

## 2023-01-17 ENCOUNTER — Emergency Department (HOSPITAL_COMMUNITY): Payer: 59

## 2023-01-17 ENCOUNTER — Other Ambulatory Visit: Payer: Self-pay

## 2023-01-17 ENCOUNTER — Emergency Department (HOSPITAL_COMMUNITY)
Admission: EM | Admit: 2023-01-17 | Discharge: 2023-01-17 | Disposition: A | Discharge status: Home / Self Care | Payer: 59 | Attending: Emergency Medicine | Admitting: Emergency Medicine

## 2023-01-17 ENCOUNTER — Encounter (HOSPITAL_COMMUNITY): Payer: Self-pay

## 2023-01-17 DIAGNOSIS — Z79899 Other long term (current) drug therapy: Secondary | ICD-10-CM | POA: Insufficient documentation

## 2023-01-17 DIAGNOSIS — R0602 Shortness of breath: Secondary | ICD-10-CM | POA: Diagnosis present

## 2023-01-17 DIAGNOSIS — Z1152 Encounter for screening for COVID-19: Secondary | ICD-10-CM | POA: Insufficient documentation

## 2023-01-17 DIAGNOSIS — E1165 Type 2 diabetes mellitus with hyperglycemia: Secondary | ICD-10-CM | POA: Diagnosis not present

## 2023-01-17 DIAGNOSIS — R799 Abnormal finding of blood chemistry, unspecified: Secondary | ICD-10-CM | POA: Diagnosis not present

## 2023-01-17 DIAGNOSIS — Z7984 Long term (current) use of oral hypoglycemic drugs: Secondary | ICD-10-CM | POA: Diagnosis not present

## 2023-01-17 DIAGNOSIS — I10 Essential (primary) hypertension: Secondary | ICD-10-CM | POA: Diagnosis not present

## 2023-01-17 LAB — CBC
HCT: 41.1 % (ref 39.0–52.0)
Hemoglobin: 13.3 g/dL (ref 13.0–17.0)
MCH: 27.9 pg (ref 26.0–34.0)
MCHC: 32.4 g/dL (ref 30.0–36.0)
MCV: 86.2 fL (ref 80.0–100.0)
Platelets: 340 10*3/uL (ref 150–400)
RBC: 4.77 MIL/uL (ref 4.22–5.81)
RDW: 13.2 % (ref 11.5–15.5)
WBC: 5.3 10*3/uL (ref 4.0–10.5)
nRBC: 0 % (ref 0.0–0.2)

## 2023-01-17 LAB — TROPONIN I (HIGH SENSITIVITY)
Troponin I (High Sensitivity): 4 ng/L (ref <18)
Troponin I (High Sensitivity): 4 ng/L (ref <18)

## 2023-01-17 LAB — RESP PANEL BY RT-PCR (RSV, FLU A&B, COVID)  RVPGX2
Influenza A by PCR: NEGATIVE
Influenza B by PCR: NEGATIVE
Resp Syncytial Virus by PCR: NEGATIVE
SARS Coronavirus 2 by RT PCR: NEGATIVE

## 2023-01-17 LAB — BASIC METABOLIC PANEL
Anion gap: 10 (ref 5–15)
BUN: 21 mg/dL — ABNORMAL HIGH (ref 6–20)
CO2: 24 mmol/L (ref 22–32)
Calcium: 9 mg/dL (ref 8.9–10.3)
Chloride: 102 mmol/L (ref 98–111)
Creatinine, Ser: 1.01 mg/dL (ref 0.61–1.24)
GFR, Estimated: >60 mL/min (ref >60)
Glucose, Bld: 142 mg/dL — ABNORMAL HIGH (ref 70–99)
Potassium: 3.7 mmol/L (ref 3.5–5.1)
Sodium: 136 mmol/L (ref 135–145)

## 2023-01-17 LAB — BRAIN NATRIURETIC PEPTIDE: B Natriuretic Peptide: 10.1 pg/mL (ref 0.0–100.0)

## 2023-01-17 NOTE — Discharge Instructions (Signed)
Evaluation for your shortness of breath is overall reassuring.  Cardiac workup was relatively normal.  Recommend you follow-up with your PCP for your recurrence of your shortness of breath.  If you have new chest pain, worsening shortness of breath, calf tenderness or any other concerning complaint please return the emergency department further evaluation.

## 2023-01-17 NOTE — ED Provider Notes (Signed)
[facility] Provider Note   CSN: [id number] Arrival date & time: 01/17/23  0410     History  Chief Complaint  Patient presents with   Shortness of Breath   HPI Craig Miller is a 62 y.o. male with hypertension, diabetes and hyperlipidemia presenting for shortness of breath.  Started about 2 months ago and is intermittent.  It is worse with lying down but also can be worse with exertion.  States has had some chest pain here and there in the last 2 months but no chest pain today.  At this time he is also not currently short of breath.  Denies cough or congestion.  States however that his shortness of breath has been progressively worse which ultimately prompted him to come in today for evaluation.  She is compliant with his blood pressure medication.  Denies calf tenderness and recent immobilization.  Did mention that his right knee has also been hurting for the past 2 months intermittently but not hurting at this time.   Shortness of Breath      Home Medications Prior to Admission medications   Medication Sig Start Date End Date Taking? Authorizing Provider  amLODipine (NORVASC) 5 MG tablet Take 1 tablet (5 mg total) by mouth daily. 01/08/23   August Albino, MD  atorvastatin (LIPITOR) 20 MG tablet 1/2 tab by mouth with evening meal daily Patient not taking: Reported on 02/12/2017 01/17/17   Mack Hook, MD  blood glucose meter kit and supplies KIT Twice daily blood glucose checks 03/28/16   Mack Hook, MD  cloNIDine (CATAPRES) 0.1 MG tablet Take 0.1 mg by mouth at bedtime. 10/20/22   [provider]  glipiZIDE (GLUCOTROL XL) 5 MG 24 hr tablet Take 5 mg by mouth daily with breakfast.    [provider]  glucose blood (ACCU-CHEK AVIVA) test strip Twice daily glucose test strips 03/28/16   Mack Hook, MD  hydrochlorothiazide (HYDRODIURIL) 12.5 MG tablet Take 1 tablet (12.5 mg total) by mouth daily.  01/08/23   August Albino, MD  metFORMIN (GLUCOPHAGE-XR) 500 MG 24 hr tablet 1 tab by mouth twice daily 03/28/16   Mack Hook, MD  Omega-3 Fatty Acids (FISH OIL PO) Take by mouth daily.    [provider]  tamsulosin (FLOMAX) 0.4 MG CAPS capsule Take 1 capsule (0.4 mg total) by mouth daily. 01/08/23   August Albino, MD      Allergies    Lisinopril    Review of Systems   Review of Systems  Respiratory:  Positive for shortness of breath.     Physical Exam   Vitals:   01/17/23 1230 01/17/23 1241  BP: 119/80   Pulse: (!) 50 (!) 53  Resp: 10 12  Temp:    SpO2: 99% 99%    CONSTITUTIONAL:  well-appearing, NAD NEURO:  Alert and oriented x 3, CN 3-12 grossly intact EYES:  eyes equal and reactive ENT/NECK:  Supple, no stridor  CARDIO:  regular rate and  rhythm, appears well-perfused  PULM:  No respiratory distress, CTAB GI/GU:  non-distended, soft MSK/SPINE:  No gross deformities, no edema, moves all extremities, no swelling, erythema or effusion noted around both knees, DP pulses +2. SKIN:  no rash, atraumatic   *Additional and/or pertinent findings included in MDM below    ED Results / Procedures / Treatments   Labs (all labs ordered are listed, but only abnormal results are displayed) Labs Reviewed  BASIC METABOLIC PANEL - Abnormal; Notable for  the following components:      Result Value   Glucose, Bld 142 (*)    BUN 21 (*)    All other components within normal limits  RESP PANEL BY RT-PCR (RSV, FLU A&B, COVID)  [id number]  CBC  BRAIN NATRIURETIC PEPTIDE  TROPONIN I (HIGH SENSITIVITY)  TROPONIN I (HIGH SENSITIVITY)    EKG None  Radiology DG Chest 2 View  Result Date: 01/17/2023 CLINICAL DATA:  Shortness of breath EXAM: CHEST - 2 VIEW COMPARISON:  01/12/2011 FINDINGS: Normal heart size and mediastinal contours. No acute infiltrate or edema. No effusion or pneumothorax. No acute osseous findings. IMPRESSION: No acute finding Electronically Signed   By:  Jorje Guild M.D.   On: 01/17/2023 05:13    Procedures Procedures    Medications Ordered in ED Medications - No data to display  ED Course/ Medical Decision Making/ A&P                             Medical Decision Making Amount and/or Complexity of Data Reviewed Labs: ordered.   Initial Impression and Ddx 62 year old male who is well-appearing presenting for shortness of breath.  Exam was unremarkable.  DDx includes ACS, PE, CHF exacerbation, COPD and pneumonia. Patient PMH that increases complexity of ED encounter: Hypertension, diabetes mellitus, hyperlipidemia  Interpretation of Diagnostics I independent reviewed and interpreted the labs as followed: Hyperglycemia, elevated BUN  - I independently visualized the following imaging with scope of interpretation limited to determining acute life threatening conditions related to emergency care: Chest x-ray, which revealed no acute findings  -I personally reviewed and interpreted EKG which revealed sinus bradycardia  Patient Reassessment and Ultimate Disposition/Management Patient overall appeared very clinically well and with no presenting symptoms.  First recurrence of shortness of breath and intermittent chest pain was concern for heart failure versus ACS.  Fortunately workup was overall reassuring.  Patient continued to deny shortness of breath and chest pain for this encounter upon reassessment.  Have a pending respiratory panel but have low suspicion that he has an ongoing viral infection given no cough congestion or fever.  Advised to follow-up on MyChart if it is positive recommended conservative treatment at home.  Otherwise advised to follow-up with his PCP for recurrence of shortness of breath.  Doubt PE at this time given no chest pain or shortness of breath and not tachycardic and without tenderness.  Patient management required discussion with the following services or consulting groups:  None  Complexity of Problems  Addressed Acute complicated illness or Injury  Additional Data Reviewed and Analyzed Further history obtained from: Past medical history and medications listed in the EMR  Patient Encounter Risk Assessment None         Final Clinical Impression(s) / ED Diagnoses Final diagnoses:  SOB (shortness of breath)    Rx / DC Orders ED Discharge Orders     None         Harriet Pho, PA-C 01/17/23 1333    Davonna Belling, MD 01/21/23 [phone]

## 2023-01-17 NOTE — ED Notes (Signed)
Patient verbalizes understanding of discharge instructions. Opportunity for questioning and answers were provided. Pt discharged from ED. 

## 2023-01-17 NOTE — ED Triage Notes (Signed)
Pt arrived to triage complaining of shortness of breath when he is trying to sleep laying flat, states that it does feel better when sitting up.   Pt state that he has been having interment chest pain over the past 2 months, denies pain at this time  Also mentions pain in his left knee that he has been having for 2 months

## 2023-01-17 NOTE — ED Provider Triage Note (Signed)
Emergency Medicine Provider Triage Evaluation Note  Craig Miller , a 62 y.o. male  was evaluated in triage.  Pt complains of shortness of breath x 2 months, R knee pain for same. SOB worse with laying down, worsened tonight prompting visit to ER. Intermittent chest pressure. Recently taken off lisinopril for side effects, changed to amlodipine.   Review of Systems  Positive: SOB, chest pressure, R knee pain Negative: Fever, chills, cough, leg swelling  Physical Exam  Ht '5\' 4"'$  (1.626 m)   Wt 70.3 kg   BMI 26.61 kg/m  Gen:   Awake, no distress   Resp:  Normal effort  MSK:   Moves extremities without difficulty  Other:    Medical Decision Making  Medically screening exam initiated at 4:35 AM.  Appropriate orders placed.  Craig Miller was informed that the remainder of the evaluation will be completed by another provider, this initial triage assessment does not replace that evaluation, and the importance of remaining in the ED until their evaluation is complete.  Workup initiated   Craig Plummer, PA-C 01/17/23 [id number]

## 2023-01-21 ENCOUNTER — Encounter: Payer: Self-pay | Admitting: Family Medicine

## 2023-01-21 NOTE — Progress Notes (Signed)
If patient calls back, please schedule appointment  Attempted call x4, left VM x2 since last week and today, to discuss results. Advised to call back and schedule appointment to discuss medication changes.   A1c 8.0 - would like for him to increase metformin to 1500 daily x1 week then start 1000 BID.  HLD - plan to start crestor 40mg   PSA wnl, low concern for prostate cancer. Started flomax at last visit BMP unremarkable - ok to increase metformin  Will also mail letter to home address  The 10-year ASCVD risk score (Arnett DK, et al., 2019) is: 25.3%   Values used to calculate the score:     Age: 62 years     Sex: Male     Is Non-Hispanic African American: No     Diabetic: Yes     Tobacco smoker: No     Systolic Blood Pressure: 123456 mmHg     Is BP treated: Yes     HDL Cholesterol: 32 mg/dL     Total Cholesterol: 178 mg/dL   August Albino, MD

## 2023-02-05 NOTE — Progress Notes (Deleted)
    SUBJECTIVE:   CHIEF COMPLAINT / HPI: Discuss med changes  Diabetes A1c increased to 8   HLD ASCVD 25.3%- recommend statin  PERTINENT  PMH / PSH: ***  OBJECTIVE:   There were no vitals taken for this visit.  ***  ASSESSMENT/PLAN:   No problem-specific Assessment & Plan notes found for this encounter.     Gerrit Heck, MD [facility]

## 2023-02-06 ENCOUNTER — Ambulatory Visit: Payer: Self-pay | Admitting: Student

## 2023-02-19 ENCOUNTER — Ambulatory Visit (INDEPENDENT_AMBULATORY_CARE_PROVIDER_SITE_OTHER): Payer: 59 | Admitting: Family Medicine

## 2023-02-19 ENCOUNTER — Encounter: Payer: Self-pay | Admitting: Family Medicine

## 2023-02-19 VITALS — BP 114/75 | HR 59 | Ht 64.76 in | Wt 145.6 lb

## 2023-02-19 DIAGNOSIS — E119 Type 2 diabetes mellitus without complications: Secondary | ICD-10-CM

## 2023-02-19 DIAGNOSIS — I10 Essential (primary) hypertension: Secondary | ICD-10-CM | POA: Diagnosis not present

## 2023-02-19 DIAGNOSIS — E785 Hyperlipidemia, unspecified: Secondary | ICD-10-CM | POA: Diagnosis not present

## 2023-02-19 MED ORDER — METFORMIN HCL ER 500 MG PO TB24: 1000.0000 mg | ORAL_TABLET | Freq: Two times a day (BID) | ORAL | 0 refills | Status: DC

## 2023-02-19 MED ORDER — LOSARTAN POTASSIUM 25 MG PO TABS: 25.0000 mg | ORAL_TABLET | Freq: Every day | ORAL | 0 refills | Status: DC

## 2023-02-19 NOTE — Patient Instructions (Addendum)
It was great to meet you!  We will make the following medication adjustments today:  -Increase your Metformin to 2 tablets in the morning and 1 tablet in the evening for 1 week.  After that, take 2 tablets in the morning and 2 tablets in the evening. I sent a refill to your pharmacy.  -Stop your hydrochlorothiazide (we discarded this today for you). -Start losartan instead.  Take this once daily.  I sent a prescription to your pharmacy.  You are overdue for a diabetic eye exam. Please contact your eye doctor to schedule an appointment. Ask them to fax the notes to our office.  Follow-up in 2 weeks with Dr Barb Merino.  Take care, Dr. Anner Crete

## 2023-02-19 NOTE — Assessment & Plan Note (Signed)
Last lipid panel 01/08/2023 showed total cholesterol 178, TGs 200, HDL 32, LDL 111.  Goal LDL <70 due to DM.  -Recommended patient start statin but he declines -Wishes to try omega-3 and dietary changes -Discussed that we will recheck lipids in 3 months and if no improvement he will need statin at that time.  States he is amenable to this.

## 2023-02-19 NOTE — Progress Notes (Addendum)
    SUBJECTIVE:   CHIEF COMPLAINT / HPI:   Diabetes f/u Current meds: Metformin  BID and Glipizide  daily A1c 8.0% at last visit 1 month ago. Dr Barb Merino wanted him to increase Metformin to  daily x1 week and then  BID but was unable to get in touch with patient (called x4).   HTN f/u Currently on amlodipine  daily, HCTZ 12.5mg  daily, and clonidine 0.1 mg at night.  Patient checks BP at home.  States majority of readings are in the 140s systolic, 70-80s diastolic.  Patient feels the HCTZ does not work for him.  Good medication compliance.  PERTINENT  PMH / PSH: per HPI  OBJECTIVE:   BP 114/75   Pulse (!) 59   Ht 5' 4.76" (1.645 m)   Wt 145 lb 9.6 oz (66 kg)   SpO2 100%   BMI 24.41 kg/m   Gen: NAD, pleasant, able to participate in exam CV: RRR, normal S1/S2, no murmur Resp: Normal effort, lungs CTAB Extremities: no edema or cyanosis Skin: warm and dry, no rashes noted Neuro: alert, no obvious focal deficits Psych: Normal affect and mood Diabetic foot exam was performed with the following findings:   No deformities, ulcerations, or other skin breakdown Normal sensation of 10g monofilament Intact posterior tibialis and dorsalis pedis pulses      ASSESSMENT/PLAN:   Essential hypertension Well-controlled.  History of angioedema with ACEi. -Continue amlodipine 5 mg daily -Stop HCTZ 12.5 mg daily (medication bottle confiscated and given to Gilberto Better, CMA for proper disposal) -Start losartan 25 mg daily instead (should be on ARB due to DM). Discussed minimal risk of recurrent angioedema with ARB and patient agreeable to start. -Continue clonidine 0.1 mg daily at bedtime for now.  This was prescribed by provider at health department.  Consider trial off this in the future, especially since he is on tamsulosin now. -Follow up in 2 weeks. Check repeat BMP at that time  Diabetes mellitus (HCC) Most recent A1c 8.0% -Increase Metformin to  qAM  and  qevening for 1 week, then to  BID -Continue glipizide  daily -Would favor using SGLT2 over glipizide but patient declines this change today -Start ARB as above -Check urine microalbumin today -Foot exam WNL today -Overdue for diabetic eye exam.  Advised to schedule at his earliest convenience -Needs statin (see HLD below) -Recommend PCV-20 at follow-up  Hyperlipidemia Last lipid panel 01/08/2023 showed total cholesterol 178, TGs 200, HDL 32, LDL 111.  Goal LDL <70 due to DM.  -Recommended patient start statin but he declines -Wishes to try omega-3 and dietary changes -Discussed that we will recheck lipids in 3 months and if no improvement he will need statin at that time.  States he is amenable to this.     Maury Dus, MD [facility]

## 2023-02-19 NOTE — Assessment & Plan Note (Addendum)
Well-controlled.  History of angioedema with ACEi. -Continue amlodipine 5 mg daily -Stop HCTZ 12.5 mg daily (medication bottle confiscated and given to Gilberto Better, CMA for proper disposal) -Start losartan 25 mg daily instead (should be on ARB due to DM). Discussed minimal risk of recurrent angioedema with ARB and patient agreeable to start. -Continue clonidine 0.1 mg daily at bedtime for now.  This was prescribed by provider at health department.  Consider trial off this in the future, especially since he is on tamsulosin now. -Follow up in 2 weeks. Check repeat BMP at that time

## 2023-02-19 NOTE — Assessment & Plan Note (Addendum)
Most recent A1c 8.0% -Increase Metformin to  qAM and  qevening for 1 week, then to  BID -Continue glipizide  daily -Would favor using SGLT2 over glipizide but patient declines this change today -Start ARB as above -Check urine microalbumin today -Foot exam WNL today -Overdue for diabetic eye exam.  Advised to schedule at his earliest convenience -Needs statin (see HLD below) -Recommend PCV-20 at follow-up

## 2023-02-21 LAB — MICROALBUMIN / CREATININE URINE RATIO
Creatinine, Urine: 192.9 mg/dL
Microalb/Creat Ratio: 14 mg/g creat (ref 0–29)
Microalbumin, Urine: 26.8 ug/mL

## 2023-02-22 ENCOUNTER — Encounter: Payer: Self-pay | Admitting: Family Medicine

## 2023-02-27 ENCOUNTER — Telehealth: Payer: Self-pay | Admitting: Family Medicine

## 2023-02-27 NOTE — Telephone Encounter (Signed)
Patient walks into office requesting refill on his diabetic strips. He has the meter, just needs the test strips.   Pharmacy is [facility].  Please advise.

## 2023-02-28 ENCOUNTER — Other Ambulatory Visit: Payer: Self-pay | Admitting: Family Medicine

## 2023-02-28 DIAGNOSIS — E119 Type 2 diabetes mellitus without complications: Secondary | ICD-10-CM

## 2023-02-28 MED ORDER — BLOOD GLUCOSE MONITOR KIT: PACK | 0 refills | Status: AC

## 2023-03-01 MED ORDER — ONETOUCH ULTRA TEST VI STRP: ORAL_STRIP | 12 refills | Status: DC

## 2023-03-01 NOTE — Telephone Encounter (Signed)
Meter was sent in instead of the testing strips.   Patient reports he already has a glucometer One Touch Ultra. He reports he just needs the testing strips. He is testing TID.   Will send in.

## 2023-03-20 ENCOUNTER — Other Ambulatory Visit: Payer: Self-pay | Admitting: Family Medicine

## 2023-03-20 DIAGNOSIS — E119 Type 2 diabetes mellitus without complications: Secondary | ICD-10-CM

## 2023-05-07 ENCOUNTER — Other Ambulatory Visit: Payer: Self-pay | Admitting: Family Medicine

## 2023-11-06 DIAGNOSIS — N281 Cyst of kidney, acquired: Secondary | ICD-10-CM | POA: Insufficient documentation

## 2024-04-09 ENCOUNTER — Ambulatory Visit: Payer: Self-pay | Admitting: Internal Medicine

## 2024-04-17 ENCOUNTER — Encounter: Payer: Self-pay | Admitting: Internal Medicine

## 2024-04-17 ENCOUNTER — Ambulatory Visit: Payer: Self-pay | Admitting: Internal Medicine

## 2024-04-17 VITALS — BP 110/70 | HR 60 | Resp 16 | Ht 64.0 in | Wt 144.5 lb

## 2024-04-17 DIAGNOSIS — Z23 Encounter for immunization: Secondary | ICD-10-CM | POA: Diagnosis not present

## 2024-04-17 DIAGNOSIS — E1129 Type 2 diabetes mellitus with other diabetic kidney complication: Secondary | ICD-10-CM | POA: Diagnosis not present

## 2024-04-17 DIAGNOSIS — R809 Proteinuria, unspecified: Secondary | ICD-10-CM

## 2024-04-17 DIAGNOSIS — I10 Essential (primary) hypertension: Secondary | ICD-10-CM | POA: Diagnosis not present

## 2024-04-17 DIAGNOSIS — Z9189 Other specified personal risk factors, not elsewhere classified: Secondary | ICD-10-CM

## 2024-04-17 DIAGNOSIS — E785 Hyperlipidemia, unspecified: Secondary | ICD-10-CM

## 2024-04-17 DIAGNOSIS — E119 Type 2 diabetes mellitus without complications: Secondary | ICD-10-CM

## 2024-04-17 DIAGNOSIS — Z125 Encounter for screening for malignant neoplasm of prostate: Secondary | ICD-10-CM

## 2024-04-17 MED ORDER — BLOOD GLUCOSE MONITORING SUPPL DEVI: 0 refills | Status: AC

## 2024-04-17 MED ORDER — FISH OIL 500 MG PO CAPS: ORAL_CAPSULE | ORAL | Status: AC

## 2024-04-17 MED ORDER — LANCET DEVICE MISC: 0 refills | Status: AC

## 2024-04-17 MED ORDER — LOSARTAN POTASSIUM 25 MG PO TABS: 25.0000 mg | ORAL_TABLET | Freq: Every day | ORAL | 3 refills | Status: DC

## 2024-04-17 MED ORDER — CLONIDINE HCL 0.1 MG PO TABS: 0.1000 mg | ORAL_TABLET | Freq: Every day | ORAL | 3 refills | Status: DC

## 2024-04-17 MED ORDER — BLOOD GLUCOSE TEST VI STRP: ORAL_STRIP | 11 refills | Status: AC

## 2024-04-17 MED ORDER — GLIPIZIDE ER 5 MG PO TB24: 5.0000 mg | ORAL_TABLET | Freq: Every day | ORAL | 3 refills | Status: AC

## 2024-04-17 MED ORDER — METFORMIN HCL ER 500 MG PO TB24: ORAL_TABLET | ORAL | 3 refills | Status: AC

## 2024-04-17 MED ORDER — LANCETS MISC. MISC: 11 refills | Status: AC

## 2024-04-17 MED ORDER — AMLODIPINE BESYLATE 5 MG PO TABS: 5.0000 mg | ORAL_TABLET | Freq: Every day | ORAL | 3 refills | Status: DC

## 2024-04-17 MED ORDER — DAPAGLIFLOZIN PROPANEDIOL 5 MG PO TABS: 5.0000 mg | ORAL_TABLET | Freq: Every day | ORAL | 3 refills | Status: DC

## 2024-04-17 NOTE — Progress Notes (Unsigned)
    Subjective:    Patient ID: Craig Miller, male   DOB: 07-Nov-1960, 63 y.o.   MRN: [id number]   HPI  Here to reestablish, last seen here in 2018 Most recently being seen at [facility] primary care.     Hypertension:  developed angioedema in 12/2022 with Lisinopril  and now taking amlodipine  and clonidine 0.1 mg at bedtime.     2.  DM:  Was told he was developing issues with damage to diabetes and recommended he get started on Farxiga.  Describes having microalbuminuria.  He took the medication as a sample for 1 month about 4 months ago and reportedly, his albuminuria improved.  He could not afford the [place] subsequently due to insurance coverage.  He is currently without insurance and has an orange card.   Checks sugar perhaps every 2-3 days and only in morning.  Running generally in 150 range. He does not know his last A1C--discussed what A1C is.    Current Meds  Medication Sig   amLODipine  (NORVASC ) 5 MG tablet Take 1 tablet (5 mg total) by mouth daily.   blood glucose meter kit and supplies KIT Twice daily blood glucose checks   cloNIDine (CATAPRES) 0.1 MG tablet Take 0.1 mg by mouth at bedtime.   glipiZIDE  (GLUCOTROL  XL) 5 MG 24 hr tablet Take 5 mg by mouth daily with breakfast.   glucose blood (ONETOUCH ULTRA TEST) test strip Use to test blood sugar 3x per day. E11.9   losartan  (COZAAR ) 25 MG tablet TAKE 1 TABLET BY MOUTH AT BEDTIME   metFORMIN  (GLUCOPHAGE -XR) 500 MG 24 hr tablet TAKE 2 TABLETS BY MOUTH TWICE DAILY WITH  A  MEAL.   Omega-3 Fatty Acids (FISH OIL PO) Take by mouth daily.   Allergies  Allergen Reactions   Lisinopril  Swelling    Angioedema in lips     Review of Systems    Objective:   BP 110/70 (BP Location: Left Arm, Patient Position: Sitting, Cuff Size: Normal)   Pulse 60   Resp 16   Ht 5' 4 (1.626 m)   Wt 144 lb 8 oz (65.5 kg)   SpO2 98%   BMI 24.80 kg/m   Physical Exam   Assessment & Plan

## 2024-04-19 LAB — CBC WITH DIFFERENTIAL/PLATELET
Basophils Absolute: 0 10*3/uL (ref 0.0–0.2)
Basos: 1 %
EOS (ABSOLUTE): 0.1 10*3/uL (ref 0.0–0.4)
Eos: 3 %
Hematocrit: 44.5 % (ref 37.5–51.0)
Hemoglobin: 14 g/dL (ref 13.0–17.7)
Immature Grans (Abs): 0 10*3/uL (ref 0.0–0.1)
Immature Granulocytes: 0 %
Lymphocytes Absolute: 1.6 10*3/uL (ref 0.7–3.1)
Lymphs: 32 %
MCH: 27.9 pg (ref 26.6–33.0)
MCHC: 31.5 g/dL (ref 31.5–35.7)
MCV: 89 fL (ref 79–97)
Monocytes Absolute: 0.5 10*3/uL (ref 0.1–0.9)
Monocytes: 10 %
Neutrophils Absolute: 2.8 10*3/uL (ref 1.4–7.0)
Neutrophils: 54 %
Platelets: 349 10*3/uL (ref 150–450)
RBC: 5.02 x10E6/uL (ref 4.14–5.80)
RDW: 13.1 % (ref 11.6–15.4)
WBC: 5.1 10*3/uL (ref 3.4–10.8)

## 2024-04-19 LAB — COMPREHENSIVE METABOLIC PANEL WITH GFR
ALT: 30 IU/L (ref 0–44)
AST: 16 IU/L (ref 0–40)
Albumin: 4.2 g/dL (ref 3.9–4.9)
Alkaline Phosphatase: 80 IU/L (ref 44–121)
BUN/Creatinine Ratio: 17 (ref 10–24)
BUN: 19 mg/dL (ref 8–27)
Bilirubin Total: 0.2 mg/dL (ref 0.0–1.2)
CO2: 21 mmol/L (ref 20–29)
Calcium: 9.3 mg/dL (ref 8.6–10.2)
Chloride: 104 mmol/L (ref 96–106)
Creatinine, Ser: 1.09 mg/dL (ref 0.76–1.27)
Globulin, Total: 2.7 g/dL (ref 1.5–4.5)
Glucose: 86 mg/dL (ref 70–99)
Potassium: 4.4 mmol/L (ref 3.5–5.2)
Sodium: 138 mmol/L (ref 134–144)
Total Protein: 6.9 g/dL (ref 6.0–8.5)
eGFR: 77 mL/min/{1.73_m2} (ref >59)

## 2024-04-19 LAB — PSA: Prostate Specific Ag, Serum: 0.6 ng/mL (ref 0.0–4.0)

## 2024-04-19 LAB — MICROALBUMIN / CREATININE URINE RATIO
Creatinine, Urine: 97.7 mg/dL
Microalb/Creat Ratio: 19 mg/g{creat} (ref 0–29)
Microalbumin, Urine: 18.5 ug/mL

## 2024-04-19 LAB — LIPID PANEL W/O CHOL/HDL RATIO
Cholesterol, Total: 164 mg/dL (ref 100–199)
HDL: 32 mg/dL — ABNORMAL LOW (ref >39)
LDL Chol Calc (NIH): 107 mg/dL — ABNORMAL HIGH (ref 0–99)
Triglycerides: 136 mg/dL (ref 0–149)
VLDL Cholesterol Cal: 25 mg/dL (ref 5–40)

## 2024-04-19 LAB — HGB A1C W/O EAG: Hgb A1c MFr Bld: 7.6 % — ABNORMAL HIGH (ref 4.8–5.6)

## 2024-04-20 ENCOUNTER — Ambulatory Visit: Payer: Self-pay | Admitting: Internal Medicine

## 2024-04-24 ENCOUNTER — Telehealth: Payer: Self-pay | Admitting: Internal Medicine

## 2024-04-24 NOTE — Telephone Encounter (Signed)
 Pharmacy called today and reported that patient stopped by today to pick up medication, patient stated that he was told by [facility] to go today and pick up medication.  Dawn from health department pharmacy notify us  that patient just cancelled insurance last Friday .  Pharmacy fill 3 medications for patient Amlodipine , Clonidine and Losartan .   Patient still needs to apply to the MAP program for Farxiga.

## 2024-05-25 MED ORDER — ATORVASTATIN CALCIUM 20 MG PO TABS: 20.0000 mg | ORAL_TABLET | Freq: Every day | ORAL | 11 refills | Status: DC

## 2024-06-05 ENCOUNTER — Other Ambulatory Visit: Payer: Self-pay

## 2024-07-07 ENCOUNTER — Telehealth: Payer: Self-pay | Admitting: Internal Medicine

## 2024-07-07 NOTE — Telephone Encounter (Signed)
 Patient needs an appointment for patient is having kidney problems.   Patient states he is not sleeping at night  due to pain on right side and describes it being his kidney,patient states sometimes he also has lower back pain.   Symptoms started long time ago  but pain has been worst for the past 5 days.

## 2024-07-07 NOTE — Telephone Encounter (Signed)
 Patient available any day at any time .

## 2024-07-10 NOTE — Telephone Encounter (Signed)
 Patient has been scheduled

## 2024-07-13 NOTE — Telephone Encounter (Signed)
 Patient called and cancel appointment,    Patient states he is only available between 9am and 12pm

## 2024-07-14 ENCOUNTER — Ambulatory Visit: Payer: Self-pay | Admitting: Internal Medicine

## 2024-07-24 ENCOUNTER — Other Ambulatory Visit: Payer: Self-pay

## 2024-07-24 ENCOUNTER — Ambulatory Visit: Payer: Self-pay

## 2024-07-24 DIAGNOSIS — E785 Hyperlipidemia, unspecified: Secondary | ICD-10-CM

## 2024-07-24 NOTE — Telephone Encounter (Signed)
 Copied from CRM [id number]. Topic: Clinical - Red Word Triage >> Jul 24, 2024 11:25 AM Delon HERO wrote: Red Word that prompted transfer to Nurse Triage: Patient is calling to report that he is having pain 3-4 months in his kidneys on the right side. That can keep him up at night.

## 2024-07-24 NOTE — Telephone Encounter (Signed)
 Patient has been scheduled

## 2024-07-24 NOTE — Telephone Encounter (Signed)
Unable to reach pt x3 attempts 

## 2024-07-24 NOTE — Telephone Encounter (Signed)
 Pt is calling to establish care at [facility], but is experiencing pain so PAS attempted to transfer to NT for triage. Pt was no longer on the line when PAS attempted to connect.   Triager LVM to return call for further assistance.

## 2024-07-25 LAB — COMPREHENSIVE METABOLIC PANEL WITH GFR
ALT: 31 IU/L (ref 0–44)
AST: 18 IU/L (ref 0–40)
Albumin: 4.4 g/dL (ref 3.9–4.9)
Alkaline Phosphatase: 98 IU/L (ref 47–123)
BUN/Creatinine Ratio: 17 (ref 10–24)
BUN: 17 mg/dL (ref 8–27)
Bilirubin Total: 0.4 mg/dL (ref 0.0–1.2)
CO2: 21 mmol/L (ref 20–29)
Calcium: 9.1 mg/dL (ref 8.6–10.2)
Chloride: 102 mmol/L (ref 96–106)
Creatinine, Ser: 0.98 mg/dL (ref 0.76–1.27)
Globulin, Total: 2.5 g/dL (ref 1.5–4.5)
Glucose: 79 mg/dL (ref 70–99)
Potassium: 3.8 mmol/L (ref 3.5–5.2)
Sodium: 142 mmol/L (ref 134–144)
Total Protein: 6.9 g/dL (ref 6.0–8.5)
eGFR: 88 mL/min/1.73 (ref >59)

## 2024-07-25 LAB — LIPID PANEL W/O CHOL/HDL RATIO
Cholesterol, Total: 174 mg/dL (ref 100–199)
HDL: 36 mg/dL — ABNORMAL LOW (ref >39)
LDL Chol Calc (NIH): 117 mg/dL — ABNORMAL HIGH (ref 0–99)
Triglycerides: 114 mg/dL (ref 0–149)
VLDL Cholesterol Cal: 21 mg/dL (ref 5–40)

## 2024-07-28 ENCOUNTER — Ambulatory Visit: Payer: Self-pay | Admitting: Internal Medicine

## 2024-07-28 ENCOUNTER — Encounter: Payer: Self-pay | Admitting: Internal Medicine

## 2024-07-28 VITALS — BP 112/78 | HR 73 | Resp 18 | Ht 64.0 in | Wt 143.0 lb

## 2024-07-28 DIAGNOSIS — N138 Other obstructive and reflux uropathy: Secondary | ICD-10-CM

## 2024-07-28 DIAGNOSIS — R109 Unspecified abdominal pain: Secondary | ICD-10-CM

## 2024-07-28 DIAGNOSIS — N401 Enlarged prostate with lower urinary tract symptoms: Secondary | ICD-10-CM

## 2024-07-28 LAB — POCT URINALYSIS DIPSTICK
Glucose, UA: POSITIVE — AB
Ketones, UA: NEGATIVE
Leukocytes, UA: NEGATIVE
Nitrite, UA: NEGATIVE
Protein, UA: NEGATIVE
Spec Grav, UA: 1.01
Urobilinogen, UA: 0.2 U/dL
pH, UA: 6

## 2024-07-28 MED ORDER — TAMSULOSIN HCL 0.4 MG PO CAPS: 0.4000 mg | ORAL_CAPSULE | Freq: Every day | ORAL | 11 refills | Status: AC
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
## 2024-07-30 LAB — URINE CULTURE

## 2024-08-04 ENCOUNTER — Telehealth: Payer: Self-pay | Admitting: Internal Medicine

## 2024-08-04 ENCOUNTER — Ambulatory Visit: Payer: Self-pay | Admitting: Internal Medicine

## 2024-08-04 MED ORDER — ATORVASTATIN CALCIUM 20 MG PO TABS: 20.0000 mg | ORAL_TABLET | Freq: Every day | ORAL | 11 refills | Status: AC

## 2024-08-04 NOTE — Progress Notes (Signed)
 Patient seen and evaluated with Dr. Fleta Agree with her assessment.  Patient to start Tamsulosin  for what seems to be primarily BPH. Once his insurance issues gets sorted, will add finasteride or similar.  US  ordered to be done in 2 weeks with patient paying lower cost up front to better assess flank pain.  Patient has stopped and started different coverages and he himself is confused as to whether he has coverage.    DM treatment limited at this point as not clear what coverage he has. Clarified today he is also NOT taking his statin, again, due to coverage confusion and sent to [facility] pharm--will change it to [facility].SABRA

## 2024-08-07 NOTE — Telephone Encounter (Signed)
 Called patient, scheduled follow up appointments from his last visit . Also, notified patient Atorvastatin  medication is at [place] [address].  Asked patient if Legal aid has contacted him, patient states yes, legal aid contacted him today and left a voicemail. Patient states he will call them back on Monday.

## 2024-08-18 ENCOUNTER — Other Ambulatory Visit: Payer: Self-pay

## 2024-08-18 MED ORDER — DAPAGLIFLOZIN PROPANEDIOL 5 MG PO TABS: 5.0000 mg | ORAL_TABLET | Freq: Every day | ORAL | 3 refills | Status: DC

## 2024-08-31 ENCOUNTER — Ambulatory Visit
Admission: RE | Admit: 2024-08-31 | Discharge: 2024-08-31 | Disposition: A | Discharge status: Home / Self Care | Payer: Self-pay | Source: Ambulatory Visit | Attending: Internal Medicine | Admitting: Internal Medicine

## 2024-08-31 DIAGNOSIS — N401 Enlarged prostate with lower urinary tract symptoms: Secondary | ICD-10-CM

## 2024-08-31 DIAGNOSIS — R10A1 Flank pain, right side: Secondary | ICD-10-CM

## 2024-09-01 ENCOUNTER — Encounter: Payer: Self-pay | Admitting: Internal Medicine

## 2024-09-18 ENCOUNTER — Other Ambulatory Visit: Payer: Self-pay

## 2024-09-18 DIAGNOSIS — E1129 Type 2 diabetes mellitus with other diabetic kidney complication: Secondary | ICD-10-CM

## 2024-09-18 DIAGNOSIS — E119 Type 2 diabetes mellitus without complications: Secondary | ICD-10-CM

## 2024-09-18 DIAGNOSIS — R809 Proteinuria, unspecified: Secondary | ICD-10-CM

## 2024-09-19 LAB — HEMOGLOBIN A1C
Est. average glucose Bld gHb Est-mCnc: 166 mg/dL
Hgb A1c MFr Bld: 7.4 % — ABNORMAL HIGH (ref 4.8–5.6)

## 2024-09-19 LAB — MICROALBUMIN / CREATININE URINE RATIO
Creatinine, Urine: 113.2 mg/dL
Microalb/Creat Ratio: 10 mg/g{creat} (ref 0–29)
Microalbumin, Urine: 11.6 ug/mL

## 2024-09-23 ENCOUNTER — Encounter: Payer: Self-pay | Admitting: Internal Medicine

## 2024-09-23 ENCOUNTER — Ambulatory Visit (INDEPENDENT_AMBULATORY_CARE_PROVIDER_SITE_OTHER): Payer: Self-pay | Admitting: Internal Medicine

## 2024-09-23 VITALS — BP 138/70 | HR 56 | Resp 22 | Ht 64.0 in | Wt 142.0 lb

## 2024-09-23 DIAGNOSIS — N138 Other obstructive and reflux uropathy: Secondary | ICD-10-CM

## 2024-09-23 DIAGNOSIS — N401 Enlarged prostate with lower urinary tract symptoms: Secondary | ICD-10-CM | POA: Insufficient documentation

## 2024-09-23 DIAGNOSIS — R809 Proteinuria, unspecified: Secondary | ICD-10-CM | POA: Insufficient documentation

## 2024-09-23 DIAGNOSIS — E1129 Type 2 diabetes mellitus with other diabetic kidney complication: Secondary | ICD-10-CM

## 2024-09-23 DIAGNOSIS — I10 Essential (primary) hypertension: Secondary | ICD-10-CM

## 2024-09-23 DIAGNOSIS — Z0000 Encounter for general adult medical examination without abnormal findings: Secondary | ICD-10-CM

## 2024-09-23 DIAGNOSIS — E785 Hyperlipidemia, unspecified: Secondary | ICD-10-CM

## 2024-09-23 DIAGNOSIS — N281 Cyst of kidney, acquired: Secondary | ICD-10-CM

## 2024-09-23 DIAGNOSIS — Z23 Encounter for immunization: Secondary | ICD-10-CM

## 2024-09-23 DIAGNOSIS — E119 Type 2 diabetes mellitus without complications: Secondary | ICD-10-CM

## 2024-09-23 MED ORDER — DUTASTERIDE 0.5 MG PO CAPS: 0.5000 mg | ORAL_CAPSULE | Freq: Every day | ORAL | 11 refills | Status: AC

## 2024-09-23 MED ORDER — DAPAGLIFLOZIN PROPANEDIOL 10 MG PO TABS
10.0000 mg | ORAL_TABLET | Freq: Every day | ORAL | 3 refills | Status: AC
Start: 2024-09-23 — End: ?
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
## 2024-10-20 ENCOUNTER — Other Ambulatory Visit (INDEPENDENT_AMBULATORY_CARE_PROVIDER_SITE_OTHER): Payer: Self-pay

## 2024-10-20 DIAGNOSIS — Z0000 Encounter for general adult medical examination without abnormal findings: Secondary | ICD-10-CM

## 2024-10-20 LAB — POC FIT TEST STOOL: Fecal Occult Blood: NEGATIVE

## 2024-10-22 ENCOUNTER — Telehealth: Payer: Self-pay | Admitting: Internal Medicine

## 2024-10-22 NOTE — Telephone Encounter (Signed)
 Patient called and requested for medications  amLODipine  (NORVASC ) 5 MG tablet [[id number]]   cloNIDine  (CATAPRES ) 0.1 MG tablet [[id number]    Please change medications to pharmacy   [id number] - [place] ([place]), [place] - [address], [place] ([place]) [place] [address] Phone: [phone]  Fax: [phone]

## 2024-10-23 MED ORDER — AMLODIPINE BESYLATE 5 MG PO TABS: 5.0000 mg | ORAL_TABLET | Freq: Every day | ORAL | 3 refills | Status: AC

## 2024-10-23 MED ORDER — CLONIDINE HCL 0.1 MG PO TABS: 0.1000 mg | ORAL_TABLET | Freq: Every day | ORAL | 3 refills | Status: AC

## 2024-10-23 NOTE — Telephone Encounter (Signed)
 Refill sent and the patient is notified

## 2024-11-13 ENCOUNTER — Other Ambulatory Visit: Payer: Self-pay

## 2024-11-19 ENCOUNTER — Ambulatory Visit: Payer: Self-pay | Admitting: Internal Medicine

## 2024-11-25 ENCOUNTER — Telehealth: Payer: Self-pay

## 2024-11-25 MED ORDER — LOSARTAN POTASSIUM 25 MG PO TABS: 25.0000 mg | ORAL_TABLET | Freq: Every day | ORAL | 2 refills | Status: AC

## 2024-11-25 NOTE — Telephone Encounter (Signed)
 The patient called today asking to send Losartan  to [facility] at [address]  It was sent the patient was notified

## 2025-01-29 ENCOUNTER — Other Ambulatory Visit: Payer: Self-pay

## 2025-02-02 ENCOUNTER — Ambulatory Visit: Payer: Self-pay | Admitting: Internal Medicine

## 2025-03-23 ENCOUNTER — Other Ambulatory Visit: Payer: Self-pay

## 2025-03-30 ENCOUNTER — Ambulatory Visit: Payer: Self-pay | Admitting: Internal Medicine

## 2025-09-23 ENCOUNTER — Other Ambulatory Visit: Payer: Self-pay | Admitting: Internal Medicine

## 2025-09-27 ENCOUNTER — Encounter: Payer: Self-pay | Admitting: Internal Medicine
